# Patient Record
Sex: Female | Born: 1980 | Race: White | Hispanic: No | Marital: Married | Smoking: Former smoker
Health system: Southern US, Community
[De-identification: ages and names within clinical notes are randomized; demographics above are authoritative.]

## PROBLEM LIST (undated history)

## (undated) DIAGNOSIS — H469 Unspecified optic neuritis: Secondary | ICD-10-CM

## (undated) DIAGNOSIS — G35 Multiple sclerosis: Secondary | ICD-10-CM

## (undated) DIAGNOSIS — G43909 Migraine, unspecified, not intractable, without status migrainosus: Secondary | ICD-10-CM

## (undated) DIAGNOSIS — Z8616 Personal history of COVID-19: Secondary | ICD-10-CM

## (undated) DIAGNOSIS — M542 Cervicalgia: Secondary | ICD-10-CM

## (undated) HISTORY — DX: Migraine, unspecified, not intractable, without status migrainosus: G43.909

## (undated) HISTORY — PX: OVARY SURGERY: SHX727

## (undated) HISTORY — DX: Cervicalgia: M54.2

## (undated) HISTORY — DX: Personal history of COVID-19: Z86.16

---
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
## 2000-08-08 ENCOUNTER — Other Ambulatory Visit: Admission: RE | Admit: 2000-08-08 | Discharge: 2000-08-08 | Payer: Self-pay | Admitting: Gynecology

## 2001-05-11 ENCOUNTER — Ambulatory Visit (HOSPITAL_COMMUNITY): Admission: RE | Admit: 2001-05-11 | Discharge: 2001-05-11 | Payer: Self-pay | Admitting: Gynecology

## 2001-05-11 ENCOUNTER — Encounter (INDEPENDENT_AMBULATORY_CARE_PROVIDER_SITE_OTHER): Payer: Self-pay | Admitting: Specialist

## 2001-05-25 ENCOUNTER — Emergency Department (HOSPITAL_COMMUNITY): Admission: EM | Admit: 2001-05-25 | Discharge: 2001-05-25 | Payer: Self-pay | Admitting: *Deleted

## 2001-10-13 ENCOUNTER — Encounter: Payer: Self-pay | Admitting: *Deleted

## 2001-10-13 ENCOUNTER — Emergency Department (HOSPITAL_COMMUNITY): Admission: EM | Admit: 2001-10-13 | Discharge: 2001-10-13 | Payer: Self-pay | Admitting: Emergency Medicine

## 2002-12-24 ENCOUNTER — Other Ambulatory Visit: Admission: RE | Admit: 2002-12-24 | Discharge: 2002-12-24 | Payer: Self-pay | Admitting: Gynecology

## 2003-08-05 ENCOUNTER — Inpatient Hospital Stay (HOSPITAL_COMMUNITY): Admission: AD | Admit: 2003-08-05 | Discharge: 2003-08-07 | Payer: Self-pay | Admitting: Gynecology

## 2003-10-20 ENCOUNTER — Other Ambulatory Visit: Admission: RE | Admit: 2003-10-20 | Discharge: 2003-10-20 | Payer: Self-pay | Admitting: Gynecology

## 2004-12-26 ENCOUNTER — Other Ambulatory Visit: Admission: RE | Admit: 2004-12-26 | Discharge: 2004-12-26 | Payer: Self-pay | Admitting: Gynecology

## 2005-12-12 ENCOUNTER — Other Ambulatory Visit: Admission: RE | Admit: 2005-12-12 | Discharge: 2005-12-12 | Payer: Self-pay | Admitting: Gynecology

## 2006-05-19 ENCOUNTER — Encounter: Admission: RE | Admit: 2006-05-19 | Discharge: 2006-05-19 | Payer: Self-pay | Admitting: Women's Health

## 2006-07-01 ENCOUNTER — Inpatient Hospital Stay (HOSPITAL_COMMUNITY): Admission: AD | Admit: 2006-07-01 | Discharge: 2006-07-03 | Payer: Self-pay | Admitting: Gynecology

## 2006-08-12 ENCOUNTER — Other Ambulatory Visit: Admission: RE | Admit: 2006-08-12 | Discharge: 2006-08-12 | Payer: Self-pay | Admitting: Gynecology

## 2007-10-30 ENCOUNTER — Other Ambulatory Visit: Admission: RE | Admit: 2007-10-30 | Discharge: 2007-10-30 | Payer: Self-pay | Admitting: Gynecology

## 2007-12-29 ENCOUNTER — Encounter: Payer: Self-pay | Admitting: Gynecology

## 2007-12-29 ENCOUNTER — Ambulatory Visit (HOSPITAL_BASED_OUTPATIENT_CLINIC_OR_DEPARTMENT_OTHER): Admission: RE | Admit: 2007-12-29 | Discharge: 2007-12-29 | Payer: Self-pay | Admitting: Gynecology

## 2011-02-05 NOTE — Op Note (Signed)
Lisa Wu, Lisa Wu             ACCOUNT NO.:  [id number]   MEDICAL RECORD NO.:  [id number]          PATIENT TYPE:  AMB   LOCATION:  [facility]                         FACILITY:  [facility]   PHYSICIAN:  Timothy P. Fontaine, M.D.DATE OF BIRTH:  [birth year]   DATE OF PROCEDURE:  12/29/2007  DATE OF DISCHARGE:                               OPERATIVE REPORT   PREOPERATIVE DIAGNOSIS:  Irregular bleeding.   POSTOPERATIVE DIAGNOSIS:  Irregular bleeding.   PROCEDURE:  Hysteroscopy, D&C, removal of IUD.   SURGEON:  Timothy P. Fontaine, M.D.   ANESTHETIC:  General, with 1% lidocaine paracervical block.   COMPLICATIONS:  None.   ESTIMATED BLOOD LOSS:  Minimal.   SORBITOL DISCREPANCY:  Minimal.   SPECIMENS:  1. IUD, Mirena.  2. Endometrial curetting.   FINDINGS:  EUA, external BUS, vagina normal.  Cervix normal.  Uterus  normal size, shape and contour, retroverted.  Adnexa without masses.  Hysteroscopic was adequate, with right and left tubal ostia, fundus,  anterior-posterior uterine surfaces, lower uterine segment, endocervical  canal all visualized.  There were several areas of tufting of the  endometrium, with the remainder of the endometrium an atrophic pattern,  consistent with her [place].  Post D&C, all tufted areas were removed and  sent to pathology.   PROCEDURE:  The patient was taken to the operating room, underwent  general anesthesia, and was placed in the low dorsal lithotomy position,  received a perineovaginal preparation with Betadine solution.  Bladder  emptied with in-and-out Foley catheterization done in sterile technique.  EUA performed, and the patient was draped in the usual fashion.  The  cervix was visualized with a speculum.  Anterior lip grasped with a  single-tooth tenaculum.  Paracervical block using 1% lidocaine was  placed, a total of 10 mL.  Using forceps, the IUD string was grasped  within the endocervical canal, and the IUD was removed and sent to  pathology for gross only.  The cervix was then gently gradually dilated  to admit the operative hysteroscope.  Hysteroscopy was performed, with  findings noted above.  A sharp curettage was performed.  Specimen sent  to pathology.  Re-hysteroscopy showed an empty cavity,  good distention, no evidence of perforation.  The instruments were  removed.  Hemostasis visualized.  The speculum removed.  The patient was  placed in the supine position, awakened without difficulty, and taken to  the recovery room in good condition, having tolerated the procedure  well.      Timothy P. Fontaine, M.D.  Electronically Signed     TPF/MEDQ  D:  12/29/2007  T:  12/29/2007  Job:  [id number]

## 2011-02-05 NOTE — H&P (Signed)
Lisa Wu, Lisa Wu             ACCOUNT NO.:  [id number]   MEDICAL RECORD NO.:  [id number]          PATIENT TYPE:  AMB   LOCATION:  [facility]                         FACILITY:  [facility]   PHYSICIAN:  Timothy P. Fontaine, M.D.DATE OF BIRTH:  [birth year]   DATE OF ADMISSION:  12/29/2007  DATE OF DISCHARGE:                              HISTORY & PHYSICAL   The patient is being admitted to [facility] on December 29, 2007, at 7:30 for surgery.   CHIEF COMPLAINT:  Continued irregular bleeding.   HISTORY OF PRESENT ILLNESS:  A 30 year old, G2, P2 female with Jearld Adjutant  IUD who has continued have on and off bleeding despite trials of  nonsteroidals as well as low estrogen dose replacement.  She had an HSG  which showed a questionable endometrial polyp.  She is admitted for  removal of her IUD, hysteroscopy D&C.   PAST MEDICAL HISTORY:  Significant for MS.   PAST SURGICAL HISTORY:  Hysteroscopy D&C for polyp in 2002.   CURRENT MEDICATIONS:  1. Multivitamins.  2. Prozac 20 mg daily.   ALLERGIES:  MACROBID.   REVIEW OF SYSTEMS:  Noncontributory.   FAMILY HISTORY:  Noncontributory.   SOCIAL HISTORY:  Noncontributory.   ADMISSION PHYSICAL EXAMINATION:  HEENT:  Normal.  LUNGS:  Clear.  CARDIAC:  Regular rate.  No rubs, murmurs or gallops.  ABDOMINAL:  Exam benign.  PELVIC:  External BUS, vagina normal.  Cervix grossly normal. Uterus  normal size, midline, mobile, nontender.  Adnexa without masses or  tenderness.   ASSESSMENT AND PLAN:  A 30 year old, G2, P2 female with persistent  spotting with IUD in place after intercourse or any exercising or  jogging.  She has tried estrogen supplementation, nonsteroidals without  effect.  Sonohistogram with IUD in place implies endometrial cavity  defect suggestive of a polyp.  I reviewed situation with the patient.  It is very difficult to totally evaluate with IUD in place. Options of  continued monitoring with IUD versus removal of  the IUD, hysteroscopy  D&C was discussed.  The patient wants to go ahead and have it removed  with hysteroscopy D&C.  She has made arrangements to have it replaced 2  weeks postop with abstinence and between pending surgery results.  I  reviewed what is involved with the procedure, expected intraoperative  and postoperative courses, use of the hysteroscope, resectoscope, D&C.  She notes we are removing her IUD, she will have no birth control, and  has made arrangements to have it replaced. She understands there are no  guarantees as far as bleeding relief, that her irregular bleeding may  continue, worsen or change following this procedure.  I reviewed the  risks of the procedure to include bleeding, transfusion, infection,  uterine perforation, damage to internal organs including bowel, bladder,  ureters, vessels and nerves necessitating major exploratory reparative  surgeries, future reparative surgeries, ostomy formation all discussed,  understood and accepted.  The patient's questions were answered to her  satisfaction.  She is ready to proceed with surgery.      Timothy P. Fontaine, M.D.  Electronically Signed  TPF/MEDQ  D:  12/28/2007  T:  12/28/2007  Job:  [id number]

## 2011-02-08 NOTE — Op Note (Signed)
[facility]  Patient:    Lisa Wu, Lisa Wu Visit Number: [id number] MRN: [id number]          Service Type: DSU Location: [facility] Attending Physician:  Merrily Pew Proc. Date: 05/11/01 Adm. Date:  [id number]                             Operative Report  PREOPERATIVE DIAGNOSIS:       Endometrial polyp.  POSTOPERATIVE DIAGNOSIS:      Endometrial polyp.  PROCEDURE:                    Hysteroscopic resection of endometrial polyp.  SURGEON:                      Timothy P. Fontaine, M.D.  ANESTHESIA:                   MAC, paracervical block 1% lidocaine.  ESTIMATED BLOOD LOSS:         Minimal.  COMPLICATIONS:                None.  SPECIMEN:                     1. Endometrial polyp.                               2. Endometrial curettings.  SORBITOL DISCREPANCY:         Approximately 100-140 cc.  PROCEDURE:                    The patient was taken to the operating room and underwent IV sedation.  He was placed in the low dorsal lithotomy position and received a perineal vaginal preparation with Betadine solution.  The bladder was emptied with Foley catheterization and E-wave performed.  The patient was then draped in the usual fashion and the cervix was visualized with the surgical speculum and the anterior lip grasped with a single-tooth tenaculum. A paracervical block using 1% lidocaine was placed, a total of approximately 10 cc used.  Cervix was then gradually and gently dilated to admit the hysteroscope, and hysteroscopy was performed; noting a small endometrial polyp on the posterior and mid uterine surface.  Fingerlike appearance, with no abnormality seen.  The hysteroscopy was otherwise normal, noting right and left tubal velocity of fundus, anterior and posterior uterine surfaces, lower uterine segment and endocervical all being visualized.  The poly was then excised in one pass using the right-angled loop, and subsequently a  sharp curettage was performed.  Rehysteroscopy showed good distention, no abnormalities, adequate hemostasis and no evidence of perforation.  The instruments were then removed.  There was some oozing at the anterior cervical mucosa, and two interrupted 3-0 Vicryl sutures were placed for hemostasis. Adequate hemostasis was then achieved.  The speculum was removed.  The patient was placed in the supine position and then taken to the recovery room in good condition, having tolerated the procedure well. DD:  05/11/01 TD:  05/11/01 Job: [id number]

## 2011-02-08 NOTE — H&P (Signed)
NAME:  Lisa Wu, HIPPS                       ACCOUNT NO.:  [id number]   MEDICAL RECORD NO.:  [id number]                   PATIENT TYPE:  MAT   LOCATION:  [facility]                                 FACILITY:  [facility]   PHYSICIAN:  Juan H. Lily Peer, M.D.             DATE OF BIRTH:  [birth year]   DATE OF ADMISSION:  08/05/2003  DATE OF DISCHARGE:                                HISTORY & PHYSICAL   CHIEF COMPLAINT:  Contractions.   HISTORY OF PRESENT ILLNESS:  The patient is a 30 year old gravida 1 para 0  at 37 and six-sevenths weeks gestation with an estimated date of confinement  of August 06, 2003; presented to [facility] complaining of increased  contractions in frequency and intensity at approximately 0500 this morning.  She presented to [facility] and was found to be contracting every four  to five minutes apart, very intense contractions, reassuring fetal heart  rate tracing.  Her cervix was 3 cm, 80%, -1 station, with stable vital  signs.  Prenatal course significant for the fact that the patient has  history of multiple sclerosis.  She is Rh negative, received RhoGAM at [redacted]  weeks gestation, had been treated also for urinary tract infection.  See  [place] form for additional information; otherwise, benign prenatal  course.   PAST MEDICAL HISTORY:  The patient is allergic to [facility].  She has history  of multiple sclerosis.  Had wisdom tooth removed in the past.   REVIEW OF SYSTEMS:  See [place] form.   PHYSICAL EXAMINATION:  VITAL SIGNS:  Stable; afebrile  GENERAL:  Well-developed, well-nourished female complaining of contractions.  HEENT:  Unremarkable.  NECK:  Supple, trachea midline.  No carotid bruits, no thyromegaly.  LUNGS:  Clear to auscultation without rhonchi or wheezes.  HEART:  Regular rate and rhythm, no murmurs or gallop.  BREAST:  Exam not done.  ABDOMEN:  Gravid uterus, vertex presentation by [facility] maneuver, positive  fetal heart tones.  PELVIC:  Cervix 3 cm, 80% effaced, -2 station, intact membranes, bulging.  EXTREMITIES:  DTRs 1+, negative clonus, trace edema.   PRENATAL LABORATORY DATA:  A negative blood type, negative antibody screen.  VDRL was nonreactive.  Rubella immune.  Hepatitis B surface antigen and HIV  were negative.  Alpha-fetoprotein was normal.  PPD test was negative on June  24.  Diabetes screen was normal.  GBS culture was positive.   ASSESSMENT:  A 30 year old gravida 1 para 0 at 59 and six-sevenths weeks  gestation in active labor with advanced cervical dilatation with bulging  membranes, 3 cm, 80%, -2 station.  Will be admitted to labor and delivery.  Once she has an epidural on-board will proceed with artificial rupture of  membranes and internal monitoring to include fetal scalp electrode and  intrauterine pressure catheter.  Due to the fact that she had positive GBS  culture-  proven will go ahead and start her on penicillin  G 5,000,000 units IV  initially followed by 2,500,000 units q.4h.  Anticipate vaginal delivery.   PLAN:  As per assessment above.                                               Juan H. Lily Peer, M.D.    JHF/MEDQ  D:  08/05/2003  T:  08/05/2003  Job:  [id number]

## 2011-02-08 NOTE — Discharge Summary (Signed)
NAMEBUENA, BOEHM                       ACCOUNT NO.:  [id number]   MEDICAL RECORD NO.:  [id number]                   PATIENT TYPE:  INP   LOCATION:  9140                                 FACILITY:  [facility]   PHYSICIAN:  Ivor Costa. Farrel Gobble, M.D.              DATE OF BIRTH:  1981-02-19   DATE OF ADMISSION:  08/05/2003  DATE OF DISCHARGE:  08/07/2003                                 DISCHARGE SUMMARY   DISCHARGE DIAGNOSES:  1. Intrauterine pregnancy at 39+ weeks delivered.  2. Positive group B strep.  3. The patient with multiple sclerosis.  4. Rh negative.  5. Status post spontaneous vaginal delivery.   HISTORY:  This is a 30 years of age female gravida 1, para 67 with an EDC of  August 06, 2003.  Prenatal course was complicated by Rh negative.  The  patient received RhoGAM at pregnancy.  The patient was also had multiple  sclerosis.  The patient is positive group B strep during pregnancy.   HOSPITAL COURSE:  On August 05, 2003 the patient presented at 39+ weeks in  labor, was begun on IV penicillin G for group B strep prophylaxis.  Labor  was augmented with Pitocin and thus on August 05, 2003 the patient  underwent a spontaneous vaginal delivery of a female, Apgars of 9 and 10,  weight of 8 pounds and 1 ounce.  A second-degree which was repaired without  complications.  It was noted that there was recurrent episodes of uterine  atony with increased vaginal bleeding despite continuous uterine massage.  Therefore, the patient was treated with p.o. Cytotec with instant results.  Postpartum, the patient remained afebrile, voiding, in stable condition, and  she was discharged to home on August 07, 2003 in satisfactory condition  and given [facility] Gynecology postpartum instruction.  Postpartum labs,  the patient is O negative, rubella immune.  On August 06, 2003 hemoglobin  was 12.   DISPOSITION:  The patient is discharged to home, for return in six weeks  will have to call  the office.  Was given prescription for Tylox p.r.n. pain  and was given RhoGAM prior to discharge.     Susa Loffler, P.A.                    Ivor Costa. Farrel Gobble, M.D.    TSG/MEDQ  D:  08/29/2003  T:  08/29/2003  Job:  [id number]

## 2011-02-08 NOTE — H&P (Signed)
[facility]  Patient:    Lisa Wu, Lisa Wu                      MRN: [id number] Attending:  Nadyne Coombes. Fontaine, M.D.                         History and Physical  SCHEDULED ADMISSION DATE:     May 11, 2001.  CHIEF COMPLAINT:              Irregular menses, endometrial defect.  HISTORY OF PRESENT ILLNESS:   A 30 year old, G0, initially presented complaining of irregular menses. The patient had been on birth control pills until the fall of 2001 and stopped them in anticipation of pregnancy. The patient notes that she almost bled continuously on and off since then where she would bleed for a week and then stop for several days and then bleed again for a week. The patient underwent an ultrasound April 2002 with findings of fluid within the endometrial cavity outlining an echogenic area measuring 8 x 7 x 6 mm. The patient had a followup sonohysterogram following a subsequent menses and the cavity again showed persistence of this single echogenic focus on the mid posterior wall. The patient notes that since then her periods have continued to be somewhat irregular, although she is not having intermenstrual bleeding, and the options of expectant management versus hysteroscopic evaluation of the endometrium were reviewed and the patient wants to proceed with hysteroscopy. If indeed a defect is encountered, then will remove this. If not, then we will terminate the procedure. The patient also had outpatient labs including normal thyroid and normal prolactin. The patient has decided against proceeding with pregnancy trial at this time and is to begin on Ortho Tri-Cyclen with her next menses.  PAST MEDICAL HISTORY:         Significant for multiple sclerosis for which she is currently on no medication.  PAST SURGICAL HISTORY:        None.  ALLERGIES:                    None.  REVIEW OF SYSTEMS:            Noncontributory.  MEDICATIONS:                   Ortho Tri-Cyclen.  FAMILY HISTORY:               Noncontributory.  SOCIAL HISTORY:               No cigarette, alcohol use.  ADMISSION PHYSICAL EXAMINATION:  VITAL SIGNS:                  Afebrile, vital signs are stable.  HEENT:                        Normal.  LUNGS:                        Clear.  CARDIAC:                      Regular rate without rubs, murmurs, or gallops.  ABDOMEN:                      Benign.  PELVIC:  External BUS, vagina normal. Cervix normal. Uterus retroverted, normal size, nontender. Adnexa without masses or tenderness.  ASSESSMENT:                   This is a 30 year old, G0, initial history of irregular menses with intermenstrual bleeding now with irregular menses, no intermenstrual bleeding. Ultrasound evaluation shows a persistent echogenic focus within the cavity.  PLAN:                         Plan is for hysteroscopy, possible resection, possible D&C. The risks, benefits, indications, and alternatives for the procedure were discussed with her to include continued expectant management with re-ultrasound in several cycles versus hysteroscopy now. The patient would prefer hysteroscopy now. The risks, benefits, indications and alternatives for the procedure were reviewed with her to include the expected intraoperative and postoperative courses. The risks of uterine perforation leading to inadvertent injury to internal organs including bowel, bladder, ureters, vessels, and nerves necessitating major exploratory reparative surgeries and future reparative surgeries, including ostomy formation, was discussed, understood, and accepted. The risk of hemorrhage necessitating transfusion and the risks of transfusion, to include hepatitis, HIV, transfusion reaction, mad cow disease and other unknown entities was all reviewed, understood, and accepted. The risks of infection requiring prolonged antibiotics was reviewed with her. The  patients questions were answered to her satisfaction and she is ready to proceed with surgery. DD:  04/30/01 TD:  04/30/01 Job: [id number]

## 2011-06-18 LAB — POCT PREGNANCY, URINE
Operator id: 268271
Preg Test, Ur: NEGATIVE

## 2011-08-29 ENCOUNTER — Other Ambulatory Visit: Payer: Self-pay | Admitting: Obstetrics and Gynecology

## 2013-07-08 ENCOUNTER — Other Ambulatory Visit: Payer: Self-pay | Admitting: Obstetrics and Gynecology

## 2013-08-30 ENCOUNTER — Telehealth: Payer: Self-pay | Admitting: Neurology

## 2013-08-30 NOTE — Telephone Encounter (Signed)
Patient called stating that she is a patient of Dr. Sandria Manly and would like to make an appointment to discuss some numbness on her arms that has started back again last week. Please call.

## 2013-08-30 NOTE — Telephone Encounter (Signed)
Informed patient that  she has not been seen in our office since 2008(Lisa Wu), she will need to contact her pcp for a new referral  first, then she can be scheduled , patient verbalized understanding.

## 2013-08-31 DIAGNOSIS — G35 Multiple sclerosis: Secondary | ICD-10-CM | POA: Insufficient documentation

## 2013-10-12 DIAGNOSIS — G43909 Migraine, unspecified, not intractable, without status migrainosus: Secondary | ICD-10-CM | POA: Insufficient documentation

## 2013-10-12 DIAGNOSIS — N319 Neuromuscular dysfunction of bladder, unspecified: Secondary | ICD-10-CM | POA: Insufficient documentation

## 2013-10-12 DIAGNOSIS — N390 Urinary tract infection, site not specified: Secondary | ICD-10-CM | POA: Insufficient documentation

## 2015-12-07 ENCOUNTER — Emergency Department (HOSPITAL_COMMUNITY): Payer: Managed Care, Other (non HMO)

## 2015-12-07 ENCOUNTER — Observation Stay (HOSPITAL_COMMUNITY)
Admission: EM | Admit: 2015-12-07 | Discharge: 2015-12-08 | Disposition: A | Discharge status: Home / Self Care | Payer: Managed Care, Other (non HMO) | Attending: Internal Medicine | Admitting: Internal Medicine

## 2015-12-07 ENCOUNTER — Encounter (HOSPITAL_COMMUNITY): Payer: Self-pay | Admitting: Emergency Medicine

## 2015-12-07 DIAGNOSIS — G35 Multiple sclerosis: Principal | ICD-10-CM | POA: Diagnosis present

## 2015-12-07 DIAGNOSIS — H532 Diplopia: Secondary | ICD-10-CM | POA: Diagnosis not present

## 2015-12-07 DIAGNOSIS — D72829 Elevated white blood cell count, unspecified: Secondary | ICD-10-CM | POA: Diagnosis not present

## 2015-12-07 DIAGNOSIS — R42 Dizziness and giddiness: Secondary | ICD-10-CM | POA: Insufficient documentation

## 2015-12-07 HISTORY — DX: Multiple sclerosis: G35

## 2015-12-07 HISTORY — DX: Unspecified optic neuritis: H46.9

## 2015-12-07 LAB — CBC WITH DIFFERENTIAL/PLATELET
BASOS ABS: 0.1 10*3/uL (ref 0.0–0.1)
Basophils Relative: 0 %
EOS PCT: 2 %
Eosinophils Absolute: 0.3 10*3/uL (ref 0.0–0.7)
HEMATOCRIT: 40.9 % (ref 36.0–46.0)
Hemoglobin: 13.7 g/dL (ref 12.0–15.0)
LYMPHS ABS: 4.4 10*3/uL — AB (ref 0.7–4.0)
LYMPHS PCT: 38 %
MCH: 30.2 pg (ref 26.0–34.0)
MCHC: 33.5 g/dL (ref 30.0–36.0)
MCV: 90.3 fL (ref 78.0–100.0)
MONO ABS: 0.6 10*3/uL (ref 0.1–1.0)
Monocytes Relative: 5 %
NEUTROS ABS: 6.5 10*3/uL (ref 1.7–7.7)
Neutrophils Relative %: 55 %
Platelets: 280 10*3/uL (ref 150–400)
RBC: 4.53 MIL/uL (ref 3.87–5.11)
RDW: 13 % (ref 11.5–15.5)
WBC: 11.8 10*3/uL — AB (ref 4.0–10.5)

## 2015-12-07 LAB — BASIC METABOLIC PANEL
ANION GAP: 13 (ref 5–15)
BUN: 13 mg/dL (ref 6–20)
CHLORIDE: 101 mmol/L (ref 101–111)
CO2: 26 mmol/L (ref 22–32)
Calcium: 9.5 mg/dL (ref 8.9–10.3)
Creatinine, Ser: 0.64 mg/dL (ref 0.44–1.00)
GFR calc Af Amer: >60 mL/min (ref >60)
GFR calc non Af Amer: >60 mL/min (ref >60)
GLUCOSE: 111 mg/dL — AB (ref 65–99)
POTASSIUM: 3.7 mmol/L (ref 3.5–5.1)
Sodium: 140 mmol/L (ref 135–145)

## 2015-12-07 LAB — POC URINE PREG, ED: Preg Test, Ur: NEGATIVE

## 2015-12-07 MED ORDER — GADOBENATE DIMEGLUMINE 529 MG/ML IV SOLN
12.0000 mL | Freq: Once | INTRAVENOUS | Status: AC | PRN
  Administered 2015-12-07: 12 mL via INTRAVENOUS

## 2015-12-07 NOTE — ED Notes (Signed)
Pt at bedside updating patient

## 2015-12-07 NOTE — ED Notes (Signed)
Pt. reports blurred vision/double vision with dizziness and nausea onset 2 days ago , advised by her neurologist to see an opthalmologist , denies eye injury or pain , pt. stated history of multiple sclerosis .

## 2015-12-07 NOTE — ED Notes (Signed)
Neurology at bedside.

## 2015-12-07 NOTE — ED Provider Notes (Signed)
CSN: [id number]     Arrival date & time 12/07/15  2003 History  By signing my name below, I, Placido Sou, attest that this documentation has been prepared under the direction and in the presence of Roxy Horseman, PA-C. Electronically Signed: Placido Sou, ED Scribe. 12/07/2015. 9:11 PM.   Chief Complaint  Patient presents with  . Eye Problem   The history is provided by the patient. No language interpreter was used.   HPI Comments: Lisa Wu is a 35 y.o. female with a PMHx of MS and optic neuritis who presents to the Emergency Department complaining of worsening, intermittent, bilateral, double vision onset 2 days ago. She notes a hx of similar symptoms to her right eye but denies it has ever occurred bilaterally. Her neurologist recommended she go to UC who then recommended she be seen at the ED for further evaluation. She reports associated nausea and dizziness. Pt reports unassociated left sided abd pain onset 3 weeks ago that intermittently changes from a burning to a cold sensation further noting a hx of constipation and migraines. She was recently prescribed abx for a UTI. Pt denies numbness, weakness, vision loss, migraines or any other associated symptoms at this time.    Past Medical History  Diagnosis Date  . Multiple sclerosis (HCC)   . Optic neuritis    Past Surgical History  Procedure Laterality Date  . Ovary surgery     No family history on file. Social History  Substance Use Topics  . Smoking status: Current Every Day Smoker  . Smokeless tobacco: None  . Alcohol Use: No   OB History    No data available     Review of Systems  Eyes: Positive for visual disturbance (double vision).  Gastrointestinal: Positive for nausea and abdominal pain. Negative for constipation.  Neurological: Positive for dizziness. Negative for weakness, numbness and headaches.    Allergies  Review of patient's allergies indicates no known allergies.  Home Medications    Prior to Admission medications   Not on File   BP 111/80 mmHg  Pulse 80  Temp(Src) 98.2 F (36.8 C) (Oral)  Resp 16  Ht 5\' 4"  (1.626 m)  Wt 133 lb (60.328 kg)  BMI 22.82 kg/m2  SpO2 100% Physical Exam  Constitutional: She is oriented to person, place, and time. She appears well-developed and well-nourished.  HENT:  Head: Normocephalic and atraumatic.  Eyes: Conjunctivae and EOM are normal. Pupils are equal, round, and reactive to light.  Diploplia Inaccurate finger counting unless 1 eye covered Normal visual fields No sign of entrapment No pain with eye movement  Neck: Normal range of motion. Neck supple.  Cardiovascular: Normal rate and regular rhythm.  Exam reveals no gallop and no friction rub.   No murmur heard. Pulmonary/Chest: Effort normal and breath sounds normal. No respiratory distress. She has no wheezes. She has no rales. She exhibits no tenderness.  Abdominal: Soft. Bowel sounds are normal. She exhibits no distension and no mass. There is no tenderness. There is no rebound and no guarding.  Musculoskeletal: Normal range of motion. She exhibits no edema or tenderness.  Neurological: She is alert and oriented to person, place, and time.  CN 3-12 intact, in accurate finger to nose with both eyes open, accurate with one eye closed, no pronator drift, normal heel-to-shin, speech is clear Sensation and strength intact throughout  Skin: Skin is warm and dry.  Psychiatric: She has a normal mood and affect. Her behavior is normal. Judgment and thought  content normal.  Nursing note and vitals reviewed.   ED Course  Procedures  DIAGNOSTIC STUDIES: Oxygen Saturation is 100% on RA, normal by my interpretation.    COORDINATION OF CARE: 8:42 PM Discussed next steps with pt. She verbalized understanding and is agreeable with the plan.   Labs Review Labs Reviewed  CBC WITH DIFFERENTIAL/PLATELET - Abnormal; Notable for the following:    WBC 11.8 (*)    Lymphs Abs 4.4  (*)    All other components within normal limits  BASIC METABOLIC PANEL - Abnormal; Notable for the following:    Glucose, Bld 111 (*)    All other components within normal limits  POC URINE PREG, ED    Imaging Review Mr Laqueta Jean Wo Contrast  12/07/2015  CLINICAL DATA:  Initial evaluation for acute diplopia. History of multiple sclerosis. EXAM: MRI HEAD WITHOUT AND WITH CONTRAST TECHNIQUE: Multiplanar, multiecho pulse sequences of the brain and surrounding structures were obtained without and with intravenous contrast. CONTRAST:  12 cc of MultiHance. COMPARISON:  None. FINDINGS: Diffusion-weighted imaging demonstrates no evidence for acute infarct. Gray-white matter differentiation maintained. Major intracranial vascular flow voids are preserved. No acute or chronic intracranial hemorrhage. No areas of chronic infarction. Multiple scattered T2/FLAIR hyperintense foci present within the periventricular, deep, and subcortical white matter both cerebral hemispheres in a distribution most compatible with underlying demyelinating disease. Scattered infratentorial lesions present as well, most prevalent at the inferior aspect of the pons. Many of the periventricular foci oriented perpendicular to the lateral ventricles. Findings most compatible with provided history of multiple sclerosis. There is a prominent 9 mm enhancing lesion within the deep white matter of the left occipital lobe, consistent with active demyelination. Associated diffusion signal abnormality present within this lesion. No other definite evidence for active demyelination. No mass lesion, midline shift, or mass effect. No hydrocephalus. No extra-axial fluid collection. Major dural sinuses are grossly patent. Craniocervical junction normal. Pituitary gland within normal limits. No acute abnormality about the orbits. No definite evidence for active nor optic neuritis on this exam. Paranasal sinuses are clear. No mastoid effusion. Inner ear  structures normal. Bone marrow signal intensity within normal limits. No scalp soft tissue abnormality. IMPRESSION: Multiple T2/FLAIR hyperintense foci involving the supratentorial and infratentorial brain as above, compatible with provided history of multiple sclerosis. 9 mm enhancing lesion within the left occipital lobe consistent with active demyelination. Electronically Signed   By: Rise Mu M.D.   On: 12/07/2015 23:39   I have personally reviewed and evaluated these images and lab results as part of my medical decision-making.    MDM   Final diagnoses:  Double vision  MS (multiple sclerosis) (HCC)    Patient with diplopia. History of MS. Symptoms resolve with covering one eye. Discussed with Dr. Lynelle Doctor, who recommends neurology consultation.  Appreciate consultation from Dr. Cena Benton, who recommends admission by medicine.  He will continue to follow along.  I personally performed the services described in this documentation, which was scribed in my presence. The recorded information has been reviewed and is accurate.      Roxy Horseman, PA-C 12/08/15 0041  Linwood Dibbles, MD 12/08/15 1228

## 2015-12-08 ENCOUNTER — Encounter (HOSPITAL_COMMUNITY): Payer: Self-pay | Admitting: *Deleted

## 2015-12-08 DIAGNOSIS — G35 Multiple sclerosis: Secondary | ICD-10-CM | POA: Insufficient documentation

## 2015-12-08 DIAGNOSIS — D72829 Elevated white blood cell count, unspecified: Secondary | ICD-10-CM | POA: Diagnosis not present

## 2015-12-08 DIAGNOSIS — H532 Diplopia: Secondary | ICD-10-CM | POA: Diagnosis present

## 2015-12-08 LAB — URINALYSIS, ROUTINE W REFLEX MICROSCOPIC
BILIRUBIN URINE: NEGATIVE
Glucose, UA: NEGATIVE mg/dL
HGB URINE DIPSTICK: NEGATIVE
KETONES UR: NEGATIVE mg/dL
Leukocytes, UA: NEGATIVE
Nitrite: NEGATIVE
PH: 6 (ref 5.0–8.0)
Protein, ur: NEGATIVE mg/dL
SPECIFIC GRAVITY, URINE: 1.017 (ref 1.005–1.030)

## 2015-12-08 LAB — CBC
HEMATOCRIT: 40.1 % (ref 36.0–46.0)
HEMOGLOBIN: 13.3 g/dL (ref 12.0–15.0)
MCH: 29.6 pg (ref 26.0–34.0)
MCHC: 33.2 g/dL (ref 30.0–36.0)
MCV: 89.1 fL (ref 78.0–100.0)
Platelets: 262 10*3/uL (ref 150–400)
RBC: 4.5 MIL/uL (ref 3.87–5.11)
RDW: 13 % (ref 11.5–15.5)
WBC: 10 10*3/uL (ref 4.0–10.5)

## 2015-12-08 MED ORDER — METOCLOPRAMIDE HCL 5 MG/ML IJ SOLN
10.0000 mg | Freq: Four times a day (QID) | INTRAMUSCULAR | Status: AC
  Administered 2015-12-08 (×2): 10 mg via INTRAVENOUS
  Filled 2015-12-08 (×2): qty 2

## 2015-12-08 MED ORDER — SODIUM CHLORIDE 0.9 % IV SOLN
250.0000 mg | Freq: Four times a day (QID) | INTRAVENOUS | Status: DC
  Administered 2015-12-08 (×2): 250 mg via INTRAVENOUS
  Filled 2015-12-08 (×4): qty 2

## 2015-12-08 MED ORDER — ENOXAPARIN SODIUM 40 MG/0.4ML ~~LOC~~ SOLN
40.0000 mg | Freq: Every day | SUBCUTANEOUS | Status: DC
Start: 2015-12-08 — End: 2015-12-08
  Administered 2015-12-08: 40 mg via SUBCUTANEOUS
  Filled 2015-12-08: qty 0.4

## 2015-12-08 MED ORDER — PANTOPRAZOLE SODIUM 40 MG PO TBEC: 40.0000 mg | DELAYED_RELEASE_TABLET | Freq: Two times a day (BID) | ORAL | Status: DC

## 2015-12-08 MED ORDER — ASPIRIN-ACETAMINOPHEN-CAFFEINE 250-250-65 MG PO TABS: 1.0000 | ORAL_TABLET | Freq: Four times a day (QID) | ORAL | Status: DC | PRN

## 2015-12-08 MED ORDER — ONDANSETRON HCL 4 MG PO TABS: 4.0000 mg | ORAL_TABLET | Freq: Four times a day (QID) | ORAL | Status: DC | PRN

## 2015-12-08 MED ORDER — SODIUM CHLORIDE 0.9 % IV SOLN
1000.0000 mg | Freq: Once | INTRAVENOUS | Status: AC
  Administered 2015-12-08: 1000 mg via INTRAVENOUS
  Filled 2015-12-08: qty 8

## 2015-12-08 MED ORDER — DIPHENHYDRAMINE HCL 50 MG/ML IJ SOLN
25.0000 mg | Freq: Once | INTRAMUSCULAR | Status: AC
  Administered 2015-12-08: 25 mg via INTRAVENOUS
  Filled 2015-12-08: qty 1

## 2015-12-08 MED ORDER — METOCLOPRAMIDE HCL 5 MG PO TABS: 5.0000 mg | ORAL_TABLET | Freq: Four times a day (QID) | ORAL | Status: DC | PRN

## 2015-12-08 MED ORDER — DIAZEPAM 2 MG PO TABS: 1.0000 mg | ORAL_TABLET | Freq: Four times a day (QID) | ORAL | Status: DC | PRN

## 2015-12-08 MED ORDER — ONDANSETRON HCL 4 MG/2ML IJ SOLN: 4.0000 mg | Freq: Four times a day (QID) | INTRAMUSCULAR | Status: DC | PRN

## 2015-12-08 MED ORDER — SODIUM CHLORIDE 0.9% FLUSH
3.0000 mL | Freq: Two times a day (BID) | INTRAVENOUS | Status: DC
  Administered 2015-12-08 (×2): 3 mL via INTRAVENOUS

## 2015-12-08 MED ORDER — DIAZEPAM 5 MG/ML IJ SOLN
5.0000 mg | Freq: Once | INTRAMUSCULAR | Status: AC
  Administered 2015-12-08: 5 mg via INTRAVENOUS
  Filled 2015-12-08: qty 2

## 2015-12-08 MED ORDER — SODIUM CHLORIDE 0.9 % IV SOLN: 500.0000 mg | Freq: Two times a day (BID) | INTRAVENOUS | Status: DC

## 2015-12-08 MED ORDER — PANTOPRAZOLE SODIUM 40 MG PO TBEC
40.0000 mg | DELAYED_RELEASE_TABLET | Freq: Two times a day (BID) | ORAL | Status: DC
  Administered 2015-12-08 (×2): 40 mg via ORAL
  Filled 2015-12-08 (×2): qty 1

## 2015-12-08 MED ORDER — DIAZEPAM 5 MG PO TABS: 5.0000 mg | ORAL_TABLET | Freq: Four times a day (QID) | ORAL | Status: DC | PRN

## 2015-12-08 NOTE — Discharge Instructions (Signed)
Multiple Sclerosis °Multiple sclerosis (MS) is a disease of the central nervous system. It leads to the loss of the insulating covering of the nerves (myelin sheath) of your brain. When this happens, brain signals do not get sent properly or may not get sent at all. The age of onset of MS varies.  °CAUSES °The cause of MS is unknown. However, it is more common in the [place] than in the southern [place]. °RISK FACTORS °There is a higher number of women with MS than men. MS is not an illness that is passed down to you from your family members (inherited). However, your risk of MS is higher if you have a relative with MS. °SIGNS AND SYMPTOMS  °The symptoms of MS occur in episodes or attacks. These attacks may last weeks to months. There may be long periods of almost no symptoms between attacks. The symptoms of MS vary. This is because of the many different ways it affects the central nervous system. The main symptoms of MS include: °· Vision problems and eye pain. °· Numbness. °· Weakness. °· Inability to move your arms, hands, feet, or legs (paralysis). °· Balance problems. °· Tremors. °DIAGNOSIS  °Your health care provider can diagnose MS with the help of imaging exams and lab tests. These may include specialized X-ray exams and spinal fluid tests. The best imaging exam to confirm a diagnosis of MS is an MRI. °TREATMENT  °There is no known cure for MS, but there are medicines that can decrease the number and frequency of attacks. Steroids are often used for short-term relief. Physical and occupational therapy may also help. There are also many new alternative or complementary treatments available to help control the symptoms of MS. Ask your health care provider if any of these other options are right for you. °HOME CARE INSTRUCTIONS  °· Take medicines as directed by your health care provider. °· Exercise as directed by your health care provider. °SEEK MEDICAL CARE IF: °You begin to feel  depressed. °SEEK IMMEDIATE MEDICAL CARE IF: °· You develop paralysis. °· You have problems with bladder, bowel, or sexual function. °· You develop mental changes, such as forgetfulness or mood swings. °· You have a period of uncontrolled movements (seizure). °  °This information is not intended to replace advice given to you by your health care provider. Make sure you discuss any questions you have with your health care provider. °  °Document Released: 09/06/2000 Document Revised: 09/14/2013 Document Reviewed: 05/17/2013 °Elsevier Interactive Patient Education ©2016 [employer] ° °

## 2015-12-08 NOTE — Progress Notes (Signed)
[facility]  Patient Status:   New pt for [facility] this admission  [facility] is providing the following services:  HHRN and Home infusion pharmacy for home Solumedrol.  [facility] will see pt at home between 8-10 Am on Saturday 12-09-15.  If patient discharges after hours, please call [phone].   Sedalia Muta 12/08/2015, 4:14 PM

## 2015-12-08 NOTE — Discharge Planning (Signed)
AVS reviewed and given to patient who verbalizes understanding. SL flushed and pt is discharging home with SL. Dc'd ambulatory to private car home with all personal belongings accompanied by family at 39.

## 2015-12-08 NOTE — ED Notes (Signed)
Admitting at bedside 

## 2015-12-08 NOTE — Consult Note (Signed)
Neurology Consultation Reason for Consult: diplopia Referring Physician: ED PA  CC: double vision  History is obtained from: patient  HPI: Aberdeen E Boardley is a 35 y.o. female dx with MS since age of 42 not on DMT because of side effects with most.  She started seeing double yesterday and called her neurologist at [facility] in [place] who asked her to go to an ophthalmologist but as she could not get an appointment till next week she came to the ED.  MRI shows new left occipital enhancing lesion.  Has been fighting a vaginal bacterial infection and has been on flagyl.  She is now on 1000q week and her next dose is nect Monday.  I discussed the risks and benefits of iv solumedrol and she would like to come in for therapuy.  Please note that [facility] and she could possibly be discharged tomorrow to finish 2-4 other doses at home.  Denies gait instability.  She occasionally has nocturnal fevers.  No weakness numbness dysaphagia.    ROS: A 14 point ROS was performed and is negative except as noted in the HPI.   Past Medical History  Diagnosis Date  . Multiple sclerosis (HCC)   . Optic neuritis     No family history on file.   Social History:  reports that she has been smoking.  She does not have any smokeless tobacco history on file. She reports that she does not drink alcohol or use illicit drugs.  Exam: Current vital signs: BP 111/80 mmHg  Pulse 80  Temp(Src) 98.2 F (36.8 C) (Oral)  Resp 16  Ht  (1.626 m)  Wt 60.328 kg (133 lb)  BMI 22.82 kg/m2  SpO2 100% Vital signs in last 24 hours: Temp:  [98.2 F (36.8 C)] 98.2 F (36.8 C) (03/16 2012) Pulse Rate:  [80] 80 (03/16 2012) Resp:  [16] 16 (03/16 2012) BP: (111)/(80) 111/80 mmHg (03/16 2012) SpO2:  [100 %] 100 % (03/16 2012) Weight:  [60.328 kg (133 lb)] 60.328 kg (133 lb) (03/16 2012)   Physical Exam  Constitutional: Appears well-developed and well-nourished.  Psych: Affect appropriate to  situation Eyes: No scleral injection HENT: No OP obstrucion Head: Normocephalic.  Cardiovascular: Normal rate and regular rhythm.  Respiratory: Effort normal and breath sounds normal to anterior ascultation GI: Soft.  No distension. There is no tenderness.  Skin: WDI  Neuro: Mental Status: Patient is awake, alert, oriented to person, place, month, year, and situation Patient is able to give a clear and coherent history. No signs of aphasia or neglect Cranial Nerves: II: Visual Fields are full. Pupils are equal, round, and reactive to light. III,IV, VI: EOMI but diploplia can be elicited on end gaze to either side.  V: Facial sensation is symmetric to temperature VII: Facial movement is symmetric.  VIII: hearing is intact to voice X: Uvula elevates symmetrically XI: Shoulder shrug is symmetric. XII: tongue is midline without atrophy or fasciculations.  Motor: Tone is normal. Bulk is normal. 5/5 strength was present in all four extremities except for the left hip flexion that is 4/5 Sensory: Sensation is symmetric to light touch and temperature in the arms and legs. Deep Tendon Reflexes: 2+ and symmetric in the biceps and patellae.  Plantars: Toes are downgoing bilaterally.  Cerebellar: FNF and HKS are intact bilaterally    I have reviewed labs in epic and the results pertinent to this consultation are:  I have reviewed the images obtained: MRI brain shows left  occipital enhancing lesion  Impression: MS relapse4, diplopia, new enhancing left occipital lesion.  Will [facility] with [facility] so that she can finish her IV solumedriol at home if this can be completed there.  OT eval.  Follow up with her neurologist at [facility].  Needs DMT.  Eye patch to the left eye.  Reglan q6 hours for nausea.  Valium 5mg  now for vertigo from double vision.   Benadryl 5mg .   Solumedrol 1gm STAT and then 2-4 more days to be dictated by day team.  Recommendations: 1) as  above

## 2015-12-08 NOTE — H&P (Signed)
[facility] History and Physical  SAPHIA VANDERFORD WUJ:[id number] DOB: 1981/06/30 DOA: 12/07/2015  Referring physician: ED PCP: No primary care provider on file.   Chief Complaint: Double vision  HPI:  Ms. Domke is a 35 year old female with past medical history significant for multiple sclerosis and optic neuritis; who presents with complaints of double vision for the last 2 days. Symptoms are intermittent and had been worsening. She has to cover either her right or left eye in order to not see double. Previous symptoms have happened like this in the past but these only occurred on the right eye. In the last 3 weeks the patient has experienced significant symptoms of intermittent left hip/thigh sensations as though someone is putting hot liquid on her skin thereafter reports an icy hot feeling. Symptoms last 30 seconds to a minute then self resolved. Her neurologist is Dr. Gaynelle Adu a [facility]. In the past she has been tried on multiple disease modifying agents including Copaxone, Betaseron, and Tecfidera. However, reported side effects of flulike symptoms and tachycardia with nausea vomiting diarrhea for which these agents ultimately had to be stopped.  Upon admission patient was evaluated with MRI which revealed a 9 mm lesion in the posterior occipital lobe suggestive of demyelinating disease. Neurology was consulted and recommended admission to the [facility].  Review of Systems  Constitutional: Negative for fever, chills and malaise/fatigue.  HENT: Negative for hearing loss and tinnitus.   Eyes: Positive for double vision.  Respiratory: Negative for hemoptysis and sputum production.   Cardiovascular: Negative for chest pain and palpitations.  Gastrointestinal: Positive for nausea.  Genitourinary: Negative for urgency and frequency.  Musculoskeletal: Negative for back pain and neck pain.  Skin: Negative for itching and rash.  Neurological:  Positive for dizziness and sensory change.  Psychiatric/Behavioral: Negative for hallucinations and substance abuse.       Past Medical History  Diagnosis Date  . Multiple sclerosis (HCC)   . Optic neuritis      Past Surgical History  Procedure Laterality Date  . Ovary surgery        Social History:  reports that she has been smoking.  She does not have any smokeless tobacco history on file. She reports that she does not drink alcohol or use illicit drugs. Where does patient live--home  and with whom if at home? Can patient participate in ADLs? Yes  No Known Allergies  No family history on file.     Prior to Admission medications   Not on File     Physical Exam: Filed Vitals:   12/07/15 2012  BP: 111/80  Pulse: 80  Temp: 98.2 F (36.8 C)  TempSrc: Oral  Resp: 16  Height:  (1.626 m)  Weight: 60.328 kg (133 lb)  SpO2: 100%     Constitutional: Vital signs reviewed. Patient is a well-developed and well-nourished in no acute distress and cooperative with exam. Alert and oriented x3.  Head: Normocephalic and atraumatic  Ear: TM normal bilaterally  Mouth: no erythema or exudates, MMM  Eyes: PERRL. Diplopia with both eyes open. Visual field improved with covering 1 eye Neck: Supple, Trachea midline normal ROM, No JVD, mass, thyromegaly, or carotid bruit present.  Cardiovascular: RRR, S1 normal, S2 normal, no MRG, pulses symmetric and intact bilaterally  Pulmonary/Chest: CTAB, no wheezes, rales, or rhonchi  Abdominal: Soft. Non-tender, non-distended, bowel sounds are normal, no masses, organomegaly, or guarding present.  GU: no CVA tenderness Musculoskeletal: No joint deformities, erythema,  or stiffness, ROM full and no nontender Ext: no edema and no cyanosis, pulses palpable bilaterally (DP and PT)  Hematology: no cervical, inginal, or axillary adenopathy.  Neurological: A&O x3, Strenght is normal and symmetric bilaterally, cranial nerve II-XII are grossly  intact, no focal motor deficit, sensory intact to light touch bilaterally.  Skin: Warm, dry and intact. No rash, cyanosis, or clubbing.  Psychiatric: Normal mood and affect. speech and behavior is normal. Judgment and thought content normal. Cognition and memory are normal.      Data Review   Micro Results No results found for this or any previous visit (from the past 240 hour(s)).  Radiology Reports Mr Laqueta Jean Wo Contrast  12/07/2015  CLINICAL DATA:  Initial evaluation for acute diplopia. History of multiple sclerosis. EXAM: MRI HEAD WITHOUT AND WITH CONTRAST TECHNIQUE: Multiplanar, multiecho pulse sequences of the brain and surrounding structures were obtained without and with intravenous contrast. CONTRAST:  12 cc of MultiHance. COMPARISON:  None. FINDINGS: Diffusion-weighted imaging demonstrates no evidence for acute infarct. Gray-white matter differentiation maintained. Major intracranial vascular flow voids are preserved. No acute or chronic intracranial hemorrhage. No areas of chronic infarction. Multiple scattered T2/FLAIR hyperintense foci present within the periventricular, deep, and subcortical white matter both cerebral hemispheres in a distribution most compatible with underlying demyelinating disease. Scattered infratentorial lesions present as well, most prevalent at the inferior aspect of the pons. Many of the periventricular foci oriented perpendicular to the lateral ventricles. Findings most compatible with provided history of multiple sclerosis. There is a prominent 9 mm enhancing lesion within the deep white matter of the left occipital lobe, consistent with active demyelination. Associated diffusion signal abnormality present within this lesion. No other definite evidence for active demyelination. No mass lesion, midline shift, or mass effect. No hydrocephalus. No extra-axial fluid collection. Major dural sinuses are grossly patent. Craniocervical junction normal. Pituitary gland  within normal limits. No acute abnormality about the orbits. No definite evidence for active nor optic neuritis on this exam. Paranasal sinuses are clear. No mastoid effusion. Inner ear structures normal. Bone marrow signal intensity within normal limits. No scalp soft tissue abnormality. IMPRESSION: Multiple T2/FLAIR hyperintense foci involving the supratentorial and infratentorial brain as above, compatible with provided history of multiple sclerosis. 9 mm enhancing lesion within the left occipital lobe consistent with active demyelination. Electronically Signed   By: Rise Mu M.D.   On: 12/07/2015 23:39     CBC  Recent Labs Lab 12/07/15 2037  WBC 11.8*  HGB 13.7  HCT 40.9  PLT 280  MCV 90.3  MCH 30.2  MCHC 33.5  RDW 13.0  LYMPHSABS 4.4*  MONOABS 0.6  EOSABS 0.3  BASOSABS 0.1    Chemistries   Recent Labs Lab 12/07/15 2037  NA 140  K 3.7  CL 101  CO2 26  GLUCOSE 111*  BUN 13  CREATININE 0.64  CALCIUM 9.5   ------------------------------------------------------------------------------------------------------------------ estimated creatinine clearance is 85.6 mL/min (by C-G formula based on Cr of 0.64). ------------------------------------------------------------------------------------------------------------------ No results for input(s): HGBA1C in the last 72 hours. ------------------------------------------------------------------------------------------------------------------ No results for input(s): CHOL, HDL, LDLCALC, TRIG, CHOLHDL, LDLDIRECT in the last 72 hours. ------------------------------------------------------------------------------------------------------------------ No results for input(s): TSH, T4TOTAL, T3FREE, THYROIDAB in the last 72 hours.  Invalid input(s): FREET3 ------------------------------------------------------------------------------------------------------------------ No results for input(s): VITAMINB12, FOLATE, FERRITIN,  TIBC, IRON, RETICCTPCT in the last 72 hours.  Coagulation profile No results for input(s): INR, PROTIME in the last 168 hours.  No results for input(s): DDIMER in the last 72 hours.  Cardiac Enzymes No results for input(s): CKMB, TROPONINI, MYOGLOBIN in the last 168 hours.  Invalid input(s): CK ------------------------------------------------------------------------------------------------------------------ Invalid input(s): POCBNP   CBG: No results for input(s): GLUCAP in the last 168 hours.     EKG: Independently reviewed.   Assessment/Plan  Acute MS Relapsing and remitting with diplopia: Patient presented with acute symptoms of double vision. Found to have a 9 mm lesion in the posterior occipital lobe on MRI. - Admit to MedSurg bed - Neurology recommended   - 1 g of Solu-Medrol every 24 hours  - Reglan every 6 hours as needed for nausea  - Valium 5mg  prn vertigo  - Left eye patch  - Follow up with neurology for further recommendations  Leukocytosis: WBC elevated at 11.8 on admission. - Checking urinalysis  Code Status:   full Family Communication: bedside Disposition Plan: admit   Total time spent 55 minutes.Greater than 50% of this time was spent in counseling, explanation of diagnosis, planning of further management, and coordination of care  Clydie Braun [facility] Pager [phone]  If 7PM-7AM, please contact night-coverage [email or web address] Password [email or web address] 12/08/2015, 12:30 AM

## 2015-12-08 NOTE — Progress Notes (Signed)
Occupational Therapy Evaluation Patient Details Name: Lisa Wu MRN: [id number] DOB: [birth year] Today's Date: 12/08/2015    History of Present Illness 35 year old female patient with history of multiple sclerosis, followed at [facility], presented to ED on 12/07/15 with complaints of double vision.MRI brain showed new left occipital enhancing lesion. Admitted for acute MS flare. Neurology consulted and patient on pulse Solu-Medrol.   Clinical Impression   PTA, pt independent with ADL and mobility. Pt presents with c/o double vision (lateral). Pt has tried patch, but she doesn't like it. Assessed with use of partial occlusion at nasal field of non-dominant eye. Completed education regarding use of partial occlusion in conjunction with patch if needed. See below. Pt/husband verbalized understanding. Discussed use of partial occlusion with Dr. Waymon Amato.     Follow Up Recommendations  Other (comment) (optometrist if diplopia does not resolve)    Equipment Recommendations  None recommended by OT    Recommendations for Other Services       Precautions / Restrictions Precautions Precautions: Fall      Mobility Bed Mobility Overal bed mobility: Independent                Transfers Overall transfer level: Independent                    Balance                                            ADL Overall ADL's : At baseline                                       General ADL Comments: Pt able to comlete basic ADL tasks. Has difficulty with IADL tasks due to diplopia.   Recommended for pt to sit when showering adn to use handrail whenever going up/down stairs.  Discussed other management strategies for MS related symptoms.     Vision Vision Assessment?: Yes Eye Alignment: Within Functional Limits Ocular Range of Motion: Within Functional Limits Alignment/Gaze Preference: Within Defined Limits Tracking/Visual Pursuits: Decreased  smoothness of horizontal tracking Saccades: Additional head turns occurred during testing Convergence: Within functional limits Visual Fields: No apparent deficits Diplopia Assessment: Disappears with one eye closed;Present in far gaze;Objects split side to side  . Pt appears to have difficulty with accomodation. Partial occlusion used on nasal field of non-dominant eye. Pt with reduced c/o double vision with R lateral vision. Pt reports L lateral vision impaired, but better. Discussed use of glasses for functional tasks and watching TV. If ambulating pt can use patch to help with L lateral vision. Pt verbalized understanding. Husband/pt verbalized understanding of technique used to tape lens.    Perception     Praxis      Pertinent Vitals/Pain Pain Assessment: No/denies pain     Hand Dominance     Extremity/Trunk Assessment Upper Extremity Assessment Upper Extremity Assessment: Overall WFL for tasks assessed   Lower Extremity Assessment Lower Extremity Assessment: Overall WFL for tasks assessed   Cervical / Trunk Assessment Cervical / Trunk Assessment: Normal   Communication Communication Communication: No difficulties   Cognition Arousal/Alertness: Awake/alert Behavior During Therapy: WFL for tasks assessed/performed Overall Cognitive Status: Within Functional Limits for tasks assessed  General Comments       Exercises       Shoulder Instructions      Home Living Family/patient expects to be discharged to:: Private residence Living Arrangements: Spouse/significant other Available Help at Discharge: Family;Available 24 hours/day Type of Home: House Home Access: Stairs to enter [employer] of Steps: 3 Entrance Stairs-Rails: Right Home Layout: One level     Bathroom Shower/Tub: Producer, television/film/video: Standard Bathroom Accessibility: Yes   Home Equipment: Shower seat          Prior Functioning/Environment  Level of Independence: Independent        Comments: drives; cleans houses    OT Diagnosis: Disturbance of vision   OT Problem List: Impaired vision/perception   OT Treatment/Interventions:      OT Goals(Current goals can be found in the care plan section) Acute Rehab OT Goals Patient Stated Goal: [place] home OT Goal Formulation: With patient  OT Frequency:     Barriers to D/C:            Co-evaluation              End of Session Nurse Communication: Mobility status  Activity Tolerance: Patient tolerated treatment well Patient left: in bed;with call bell/phone within reach;with family/visitor present   Time: 1420-1445 OT Time Calculation (min): 25 min Charges:  OT General Charges $OT Visit: 1 Procedure OT Evaluation $OT Eval Moderate Complexity: 1 Procedure OT Treatments $Therapeutic Activity: 8-22 mins G-Codes: OT G-codes **NOT FOR INPATIENT CLASS** Functional Assessment Tool Used: clinical judgement Functional Limitation: Self care Self Care Current Status (Z6109): At least 1 percent but less than 20 percent impaired, limited or restricted Self Care Goal Status (U0454): At least 1 percent but less than 20 percent impaired, limited or restricted Self Care Discharge Status [phone]): At least 1 percent but less than 20 percent impaired, limited or restricted  Jameer Storie,HILLARY 12/08/2015, 3:02 PM   [facility], OTR/L  [phone] 12/08/2015

## 2015-12-08 NOTE — Progress Notes (Signed)
PROGRESS NOTE    LUCELLA POMMIER  UEA:[id number]  DOB: 1981-08-16  DOA: 12/07/2015 PCP: No primary care provider on file. Outpatient Specialists: Neurology: Dr. Gaynelle Adu a [facility] course: 35 year old female patient with history of multiple sclerosis, followed at [facility], presented to ED on 12/07/15 with complaints of double vision. She called her neurologist and advised to go to an ophthalmologist but could not get an appointment to see one and hence presented to ED. MRI brain showed new left occipital enhancing lesion. Admitted for acute MS flare. Neurology consulted and patient on pulse Solu-Medrol.   Assessment & Plan:   Multiple sclerosis relapse with diplopia - Patient and spouse indicated that she has had multiple flareups over the last year after being quincent for almost 5 years. - Neurology consultation appreciated and patient has been started on IV Solu-Medrol. Patient reports no change in symptoms. Would like to monitor her for improvement prior to discharging home to complete 5 days of IV steroids at home which she has done via peripheral IV in the past. - Discussed with neurology. We will get case management consultation to arrange for home IV Solu-Medrol for possible discharge on 3/18.     DVT prophylaxis: Enoxaparin  Code Status: Full  Family Communication: Discussed with spouse at bedside on 3/17.  Disposition Plan: DC home when medically stable, possibly 3/18  Consultants:  Neurology   Procedures:  None   Antimicrobials:  None   Subjective: No change in double vision. Denies headache, tingling, numbness or asymmetrical weakness. No dizziness reported.  Objective: Filed Vitals:   12/07/15 2012 12/08/15 0107 12/08/15 0202 12/08/15 0640  BP: 111/80 99/73 105/52 95/54  Pulse: 80 70 74 80  Temp: 98.2 F (36.8 C)  98.4 F (36.9 C) 98.4 F (36.9 C)  TempSrc: Oral  Oral Oral  Resp: Height:   (1.626 m)   (1.626 m)   Weight: 60.328 kg (133 lb)  59.8 kg (131 lb 13.4 oz)   SpO2: 100% 98% 99% 99%    Intake/Output Summary (Last 24 hours) at 12/08/15 1200 Last data filed at 12/08/15 0900  Gross per 24 hour  Intake    240 ml  Output      0 ml  Net    240 ml   Filed Weights   12/07/15 2012 12/08/15 0202  Weight: 60.328 kg (133 lb) 59.8 kg (131 lb 13.4 oz)    Exam:  General exam: Pleasant young female lying comfortably supine in bed.  Respiratory system: Clear. No increased work of breathing. Cardiovascular system: S1 & S2 heard, RRR. No JVD, murmurs, gallops, clicks or pedal edema. Gastrointestinal system: Abdomen is nondistended, soft and nontender. Normal bowel sounds heard. Central nervous system: Alert and oriented. Horizontal diplopia on lateral eye movements. No other focal neurological deficits.  Extremities: Symmetric 5 x 5 power.   Data Reviewed: Basic Metabolic Panel:  Recent Labs Lab 12/07/15 2037  NA 140  K 3.7  CL 101  CO2 26  GLUCOSE 111*  BUN 13  CREATININE 0.64  CALCIUM 9.5   Liver Function Tests: No results for input(s): AST, ALT, ALKPHOS, BILITOT, PROT, ALBUMIN in the last 168 hours. No results for input(s): LIPASE, AMYLASE in the last 168 hours. No results for input(s): AMMONIA in the last 168 hours. CBC:  Recent Labs Lab 12/07/15 2037 12/08/15 0540  WBC 11.8* 10.0  NEUTROABS 6.5  --   HGB 13.7 13.3  HCT 40.9 40.1  MCV 90.3 89.1  PLT 280 262   Cardiac Enzymes: No results for input(s): CKTOTAL, CKMB, CKMBINDEX, TROPONINI in the last 168 hours. BNP (last 3 results) No results for input(s): PROBNP in the last 8760 hours. CBG: No results for input(s): GLUCAP in the last 168 hours.  No results found for this or any previous visit (from the past 240 hour(s)).       Studies: Mr Lodema Pilot Contrast  21-Dec-2015  CLINICAL DATA:  Initial evaluation for acute diplopia. History of multiple sclerosis. EXAM: MRI HEAD WITHOUT AND  WITH CONTRAST TECHNIQUE: Multiplanar, multiecho pulse sequences of the brain and surrounding structures were obtained without and with intravenous contrast. CONTRAST:  12 cc of MultiHance. COMPARISON:  None. FINDINGS: Diffusion-weighted imaging demonstrates no evidence for acute infarct. Gray-white matter differentiation maintained. Major intracranial vascular flow voids are preserved. No acute or chronic intracranial hemorrhage. No areas of chronic infarction. Multiple scattered T2/FLAIR hyperintense foci present within the periventricular, deep, and subcortical white matter both cerebral hemispheres in a distribution most compatible with underlying demyelinating disease. Scattered infratentorial lesions present as well, most prevalent at the inferior aspect of the pons. Many of the periventricular foci oriented perpendicular to the lateral ventricles. Findings most compatible with provided history of multiple sclerosis. There is a prominent 9 mm enhancing lesion within the deep white matter of the left occipital lobe, consistent with active demyelination. Associated diffusion signal abnormality present within this lesion. No other definite evidence for active demyelination. No mass lesion, midline shift, or mass effect. No hydrocephalus. No extra-axial fluid collection. Major dural sinuses are grossly patent. Craniocervical junction normal. Pituitary gland within normal limits. No acute abnormality about the orbits. No definite evidence for active nor optic neuritis on this exam. Paranasal sinuses are clear. No mastoid effusion. Inner ear structures normal. Bone marrow signal intensity within normal limits. No scalp soft tissue abnormality. IMPRESSION: Multiple T2/FLAIR hyperintense foci involving the supratentorial and infratentorial brain as above, compatible with provided history of multiple sclerosis. 9 mm enhancing lesion within the left occipital lobe consistent with active demyelination. Electronically  Signed   By: Rise Mu M.D.   On: 2015/12/21 23:39        Scheduled Meds: . enoxaparin (LOVENOX) injection  40 mg Subcutaneous Daily  . metoCLOPramide (REGLAN) injection  10 mg Intravenous 4 times per day  . pantoprazole  40 mg Oral BID  . sodium chloride flush  3 mL Intravenous Q12H   Continuous Infusions:   Principal Problem:   Multiple sclerosis, relapsing-remitting (HCC) Active Problems:   Double vision   Leukocytosis    Time spent: 25 minutes.    Marcellus Scott, MD, FACP, FHM. [facility] Pager [phone]  If 7PM-7AM, please contact night-coverage [email or web address] Password [email or web address] 12/08/2015, 12:00 PM    LOS: 0 days

## 2015-12-08 NOTE — Care Management Note (Addendum)
Case Management Note  Patient Details  Name: Lisa Wu MRN: [id number] Date of Birth: [birth year]  Subjective/Objective:                    Action/Plan:  Confirmed face sheet information with patient . Patient no longer has home phone . Mobile number is correct.  MD would like OT to see patient before she is discharged today . Called OT office and they are aware and stated they will send OT to eval patient .  Patient and family aware .  MD wants patient to have eye patch for home. Bedside nurse has eye gauze and tape . Patient states tape irrigates she. Family will stop at [facility] and buy plastic eye patch.  Patient has [employer] . Called [facility] to arrange home health RN and infusion . Spoke with Marchelle Folks received authorization for [facility] to provide [facility] and infusion services. Intake ID number [id number] . Information / orders faxed to [facility] phone [phone] fax [phone] and [phone].  Patient aware and in agreement.  Expected Discharge Date:                  Expected Discharge Plan:  Home w Home Health Services  In-House Referral:     Discharge planning Services  CM Consult  Post Acute Care Choice:  Home Health Choice offered to:  Patient  DME Arranged:    DME Agency:     HH Arranged:  RN HH Agency:  [facility]  Status of Service:  Completed, signed off  Medicare Important Message Given:    Date Medicare IM Given:    Medicare IM give by:    Date Additional Medicare IM Given:    Additional Medicare Important Message give by:     If discussed at Long Length of Stay Meetings, dates discussed:    Additional Comments:  Kingsley Plan, RN 12/08/2015, 1:49 PM

## 2015-12-08 NOTE — Discharge Summary (Signed)
Physician Discharge Summary  Lisa Wu  ZOX:[id number]  DOB: 1981/09/22  DOA: 12/07/2015  PCP: No primary care provider on file.  Neurology: Dr. Gaynelle Adu a [facility]  Admit date: 12/07/2015 Discharge date: 12/08/2015  Time spent: Less than 30 minutes  Recommendations for Outpatient Follow-up:  1. Dr. Aretha Parrot, Neurology in 3 days.  Discharge Diagnoses:  Principal Problem:   Multiple sclerosis, relapsing-remitting (HCC) Active Problems:   Double vision   Leukocytosis   Discharge Condition: Stable  Diet recommendation: Regular diet  Filed Weights   12/07/15 2012 12/08/15 0202  Weight: 60.328 kg (133 lb) 59.8 kg (131 lb 13.4 oz)    History of present illness & hospital course:  35 year old female patient with history of multiple sclerosis, followed at [facility], presented to ED on 12/07/15 with complaints of double vision. She called her neurologist and advised to go to an ophthalmologist but could not get an appointment to see one and hence presented to ED. MRI brain showed new left occipital enhancing lesion. Admitted for acute MS flare. Neurology consulted and patient on pulse Solu-Medrol. She received a dose of Solumedrol 1 gm at 2 am today. No significant change in diplopia. She is anxious to go home. Neurology has seen and recommend she can complete 5 days course of Solumedrol at home which has been arranged by CM with Home Health agency. Discussed at length with Neurologist who indicates that her symptoms may take few days to improve. Discussed with patient and advised she follow up with her Neurologist early next week and also advised her not to drive until her symptoms resolve. She verbalized understanding. Patient and spouse indicated that she has had multiple flareups over the last year after being quincent for almost 5 years. Urine pregnancy test: Neg.  Consultations:  Neurology  Procedures:  None   Discharge  Exam:  Complaints: No change in double vision. Denies headache, tingling, numbness or asymmetrical weakness. No dizziness reported.  Filed Vitals:   12/07/15 2012 12/08/15 0107 12/08/15 0202 12/08/15 0640  BP: 111/80 99/73 105/52 95/54  Pulse: 80 70 74 80  Temp: 98.2 F (36.8 C)  98.4 F (36.9 C) 98.4 F (36.9 C)  TempSrc: Oral  Oral Oral  Resp: 16 16 18 18   Height: 5\' 4"  (1.626 m)  5\' 4"  (1.626 m)   Weight: 60.328 kg (133 lb)  59.8 kg (131 lb 13.4 oz)   SpO2: 100% 98% 99% 99%    General exam: Pleasant young female lying comfortably supine in bed.  Respiratory system: Clear. No increased work of breathing. Cardiovascular system: S1 & S2 heard, RRR. No JVD, murmurs, gallops, clicks or pedal edema. Gastrointestinal system: Abdomen is nondistended, soft and nontender. Normal bowel sounds heard. Central nervous system: Alert and oriented. Horizontal diplopia on lateral eye movements. No other focal neurological deficits.  Extremities: Symmetric 5 x 5 power.  Discharge Instructions      Discharge Instructions    Activity as tolerated - No restrictions    Complete by:  As directed      Call MD for:  difficulty breathing, headache or visual disturbances    Complete by:  As directed      Call MD for:  extreme fatigue    Complete by:  As directed      Call MD for:  persistant dizziness or light-headedness    Complete by:  As directed      Call MD for:  persistant nausea and vomiting  Complete by:  As directed      Call MD for:  severe uncontrolled pain    Complete by:  As directed      Call MD for:  temperature >100.4    Complete by:  As directed      Call MD for:    Complete by:  As directed   Worsening visual symptoms or new symptoms related to your Multiple Sclerosis.     Diet general    Complete by:  As directed             Medication List    TAKE these medications        aspirin-acetaminophen-caffeine 250-250-65 MG tablet  Commonly known as:  EXCEDRIN  MIGRAINE  Take 1-2 tablets by mouth every 6 (six) hours as needed for headache.     methylPREDNISolone sodium succinate 500 mg in sodium chloride 0.9 % 50 mL  Inject 500 mg into the vein 2 (two) times daily. Complete 8 doses beginning 12/09/2015 morning.  Start taking on:  12/09/2015     metroNIDAZOLE 500 MG tablet  Commonly known as:  FLAGYL  Take 500 mg by mouth See admin instructions. Take 1 tablet daily for 7 days, then take 2 tablets once a week for 12-13 weeks     pantoprazole 40 MG tablet  Commonly known as:  PROTONIX  Take 1 tablet (40 mg total) by mouth 2 (two) times daily.       Follow-up Information    Follow up with ABOU-ZEID,NUHAD. Schedule an appointment as soon as possible for a visit in 3 days.   Specialty:  Unknown Physician Specialty   Why:  Follow up re MS relapse.      Follow up with [facility].   Why:  home health RN    Contact information:   [address] [place] [address] [phone]       Get Medicines reviewed and adjusted: Please take all your medications with you for your next visit with your Primary MD  Please request your Primary MD to go over all hospital tests and procedure/radiological results at the follow up. Please ask your Primary MD to get all Hospital records sent to his/her office.  If you experience worsening of your admission symptoms, develop shortness of breath, life threatening emergency, suicidal or homicidal thoughts you must seek medical attention immediately by calling 911 or calling your MD immediately if symptoms less severe.  You must read complete instructions/literature along with all the possible adverse reactions/side effects for all the Medicines you take and that have been prescribed to you. Take any new Medicines after you have completely understood and accept all the possible adverse reactions/side effects.   Do not drive when taking pain medications.   Do not take more than prescribed  Pain, Sleep and Anxiety Medications  Special Instructions: If you have smoked or chewed Tobacco in the last 2 yrs please stop smoking, stop any regular Alcohol and or any Recreational drug use.  Wear Seat belts while driving.  Please note  You were cared for by a hospitalist during your hospital stay. Once you are discharged, your primary care physician will handle any further medical issues. Please note that NO REFILLS for any discharge medications will be authorized once you are discharged, as it is imperative that you return to your primary care physician (or establish a relationship with a primary care physician if you do not have one) for your aftercare needs so that  they can reassess your need for medications and monitor your lab values.    The results of significant diagnostics from this hospitalization (including imaging, microbiology, ancillary and laboratory) are listed below for reference.    Significant Diagnostic Studies: Mr Lodema Pilot Contrast  2016/01/02  CLINICAL DATA:  Initial evaluation for acute diplopia. History of multiple sclerosis. EXAM: MRI HEAD WITHOUT AND WITH CONTRAST TECHNIQUE: Multiplanar, multiecho pulse sequences of the brain and surrounding structures were obtained without and with intravenous contrast. CONTRAST:  12 cc of MultiHance. COMPARISON:  None. FINDINGS: Diffusion-weighted imaging demonstrates no evidence for acute infarct. Gray-white matter differentiation maintained. Major intracranial vascular flow voids are preserved. No acute or chronic intracranial hemorrhage. No areas of chronic infarction. Multiple scattered T2/FLAIR hyperintense foci present within the periventricular, deep, and subcortical white matter both cerebral hemispheres in a distribution most compatible with underlying demyelinating disease. Scattered infratentorial lesions present as well, most prevalent at the inferior aspect of the pons. Many of the periventricular foci oriented  perpendicular to the lateral ventricles. Findings most compatible with provided history of multiple sclerosis. There is a prominent 9 mm enhancing lesion within the deep white matter of the left occipital lobe, consistent with active demyelination. Associated diffusion signal abnormality present within this lesion. No other definite evidence for active demyelination. No mass lesion, midline shift, or mass effect. No hydrocephalus. No extra-axial fluid collection. Major dural sinuses are grossly patent. Craniocervical junction normal. Pituitary gland within normal limits. No acute abnormality about the orbits. No definite evidence for active nor optic neuritis on this exam. Paranasal sinuses are clear. No mastoid effusion. Inner ear structures normal. Bone marrow signal intensity within normal limits. No scalp soft tissue abnormality. IMPRESSION: Multiple T2/FLAIR hyperintense foci involving the supratentorial and infratentorial brain as above, compatible with provided history of multiple sclerosis. 9 mm enhancing lesion within the left occipital lobe consistent with active demyelination. Electronically Signed   By: Rise Mu M.D.   On: 02-Jan-2016 23:39    Microbiology: No results found for this or any previous visit (from the past 240 hour(s)).   Labs: Basic Metabolic Panel:  Recent Labs Lab Jan 02, 2016 2037  NA 140  K 3.7  CL 101  CO2 26  GLUCOSE 111*  BUN 13  CREATININE 0.64  CALCIUM 9.5   Liver Function Tests: No results for input(s): AST, ALT, ALKPHOS, BILITOT, PROT, ALBUMIN in the last 168 hours. No results for input(s): LIPASE, AMYLASE in the last 168 hours. No results for input(s): AMMONIA in the last 168 hours. CBC:  Recent Labs Lab 01-02-16 2037 12/08/15 0540  WBC 11.8* 10.0  NEUTROABS 6.5  --   HGB 13.7 13.3  HCT 40.9 40.1  MCV 90.3 89.1  PLT 280 262   Cardiac Enzymes: No results for input(s): CKTOTAL, CKMB, CKMBINDEX, TROPONINI in the last 168  hours. BNP: BNP (last 3 results) No results for input(s): BNP in the last 8760 hours.  ProBNP (last 3 results) No results for input(s): PROBNP in the last 8760 hours.  CBG: No results for input(s): GLUCAP in the last 168 hours.     Signed:  Marcellus Scott, MD, FACP, FHM. [facility] Pager [phone]  If 7PM-7AM, please contact night-coverage [email or web address] Password [email or web address] 12/08/2015, 1:52 PM

## 2015-12-08 NOTE — Progress Notes (Signed)
Discussed with patient the ability to have home health go to her house and she finishes the rest of her Solumedrol at home. Patient would very much like this. IF sent home Dr. Lavon Paganini would like her to have Solumedrol 500 mg BID for total of 8 doses. Will need to remain on Protonix for gut protection. Will need social work to get this set up.   Felicie Morn PA-C [facility] [phone]  12/08/2015, 12:18 PM   Seen with Dr. Lavon Paganini. Please see his attestation note for A/P for any additional work up recommendations.

## 2015-12-08 NOTE — ED Notes (Signed)
Pt moved in to room with bed and provided with beverage, per request.

## 2015-12-09 LAB — HEMOGLOBIN A1C
HEMOGLOBIN A1C: 5.4 % (ref 4.8–5.6)
Mean Plasma Glucose: 108 mg/dL

## 2016-01-26 ENCOUNTER — Other Ambulatory Visit: Payer: Self-pay | Admitting: Obstetrics and Gynecology

## 2016-01-31 LAB — CYTOLOGY - PAP

## 2017-01-03 ENCOUNTER — Encounter: Payer: Self-pay | Admitting: *Deleted

## 2017-01-06 ENCOUNTER — Ambulatory Visit: Payer: Managed Care, Other (non HMO) | Admitting: Diagnostic Neuroimaging

## 2017-01-07 ENCOUNTER — Telehealth: Payer: Self-pay | Admitting: *Deleted

## 2017-01-07 NOTE — Telephone Encounter (Signed)
LMVM for pt to return call to reschedule appt. 

## 2017-02-15 IMAGING — MR MR HEAD WO/W CM
12 of 17 series · 22 of 48 positions shown · IV contrast (multihance)
Comparison: None.

CLINICAL DATA: Initial evaluation for acute diplopia. History of
multiple sclerosis.

EXAM:
MRI HEAD WITHOUT AND WITH CONTRAST
TECHNIQUE: Multiplanar, multiecho pulse sequences of the brain and surrounding
structures were obtained without and with intravenous contrast.
CONTRAST:  12 cc of MultiHance.

[Series 2: FLAIR · sagittal · 5.0mm · 0.47mm/px · 1 of 23 slices shown (1 of 3)]
[im 1/23]
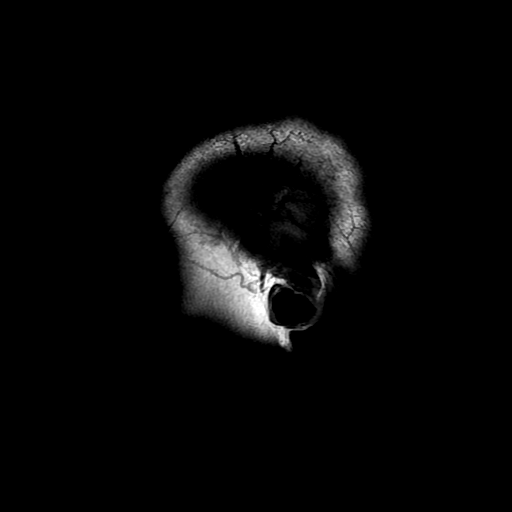

[Series 3: FLAIR · sagittal · 1.6mm · 0.49mm/px · 7 of 230 slices shown (2 of 3)]
[im 1/230]
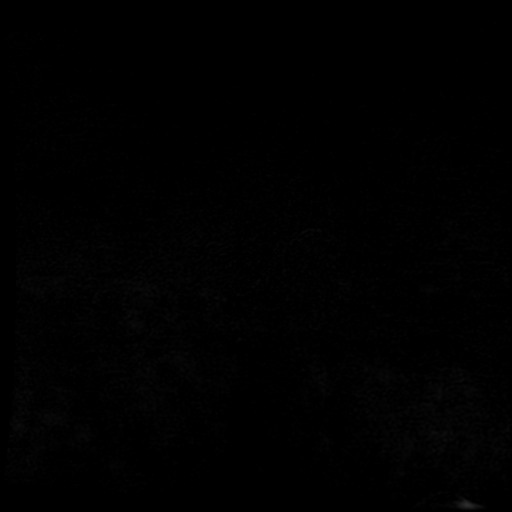
[im 39/230]
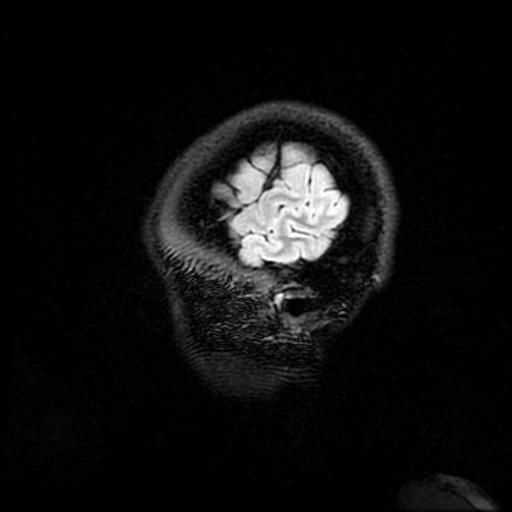
[im 77/230]
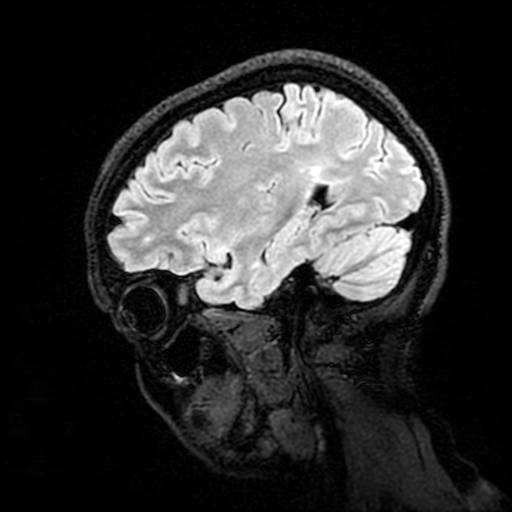
[im 115/230]
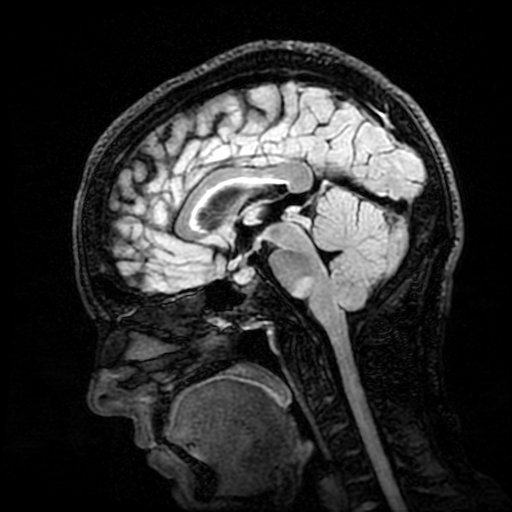
[im 153/230]
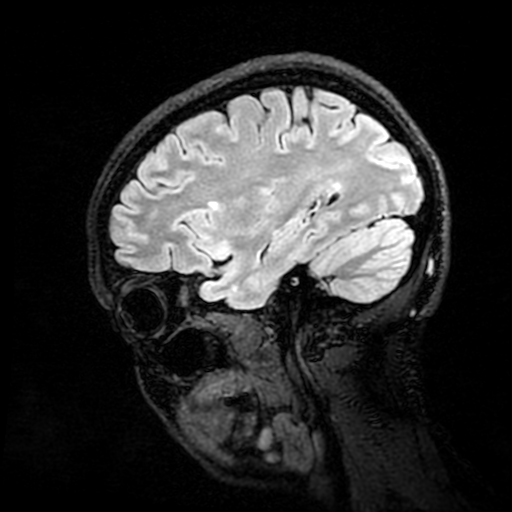
[im 191/230]
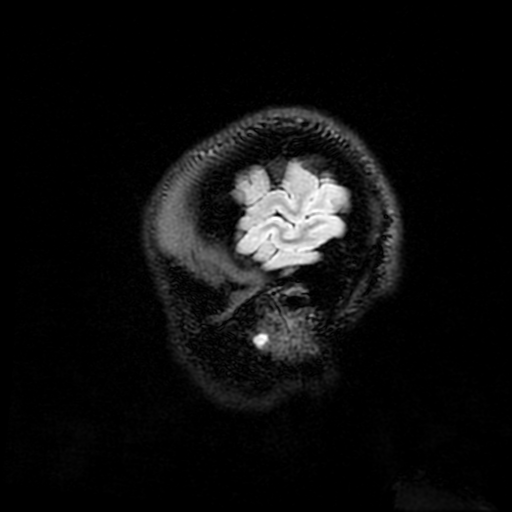
[im 230/230]
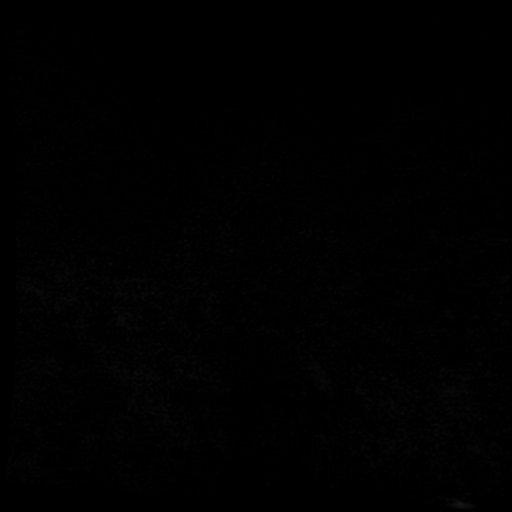

[Series 5: DWI · axial · 4.5mm · 0.94mm/px · 1 of 68 slices shown (1 of 4)]
[im 1/68]
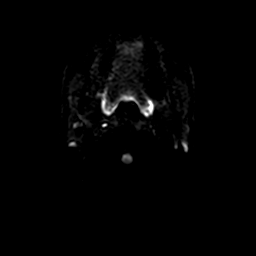

[Series 6: DWI · coronal · 5.0mm · 0.94mm/px · 2 of 64 slices shown (2 of 4)]
[im 1/64]
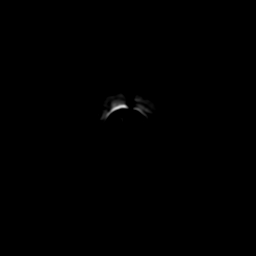
[im 64/64]
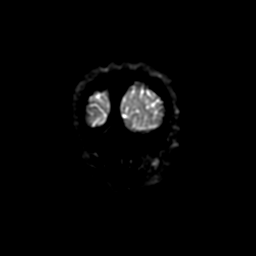

[Series 7: T2 · axial · 5.0mm · 0.47mm/px · 1 of 26 slices shown (1 of 2)]
[im 1/26]
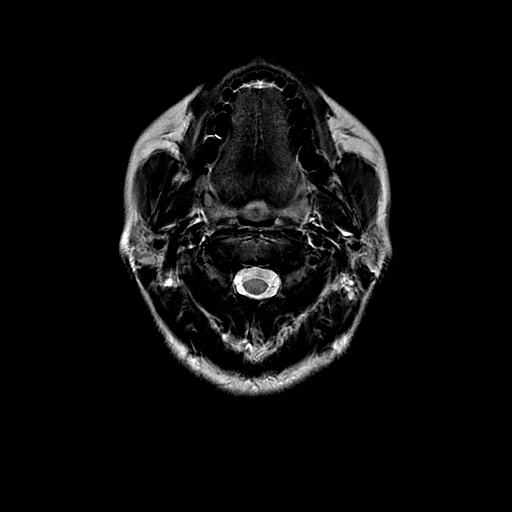

[Series 8: (person_name) · axial · 3.0mm · 0.47mm/px · z∈[-109,+33]mm · 4 of 104 slices shown]
[im 1/104]
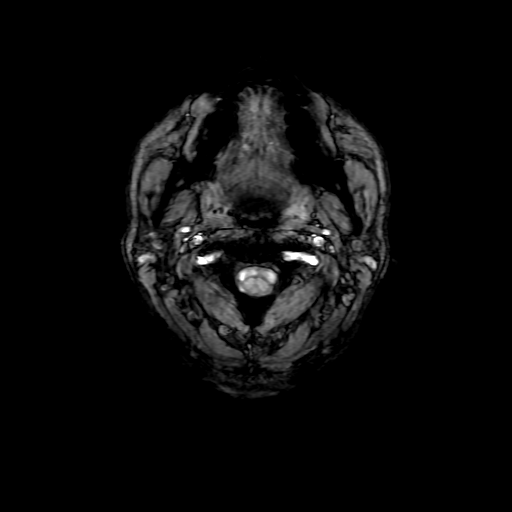
[im 35/104]
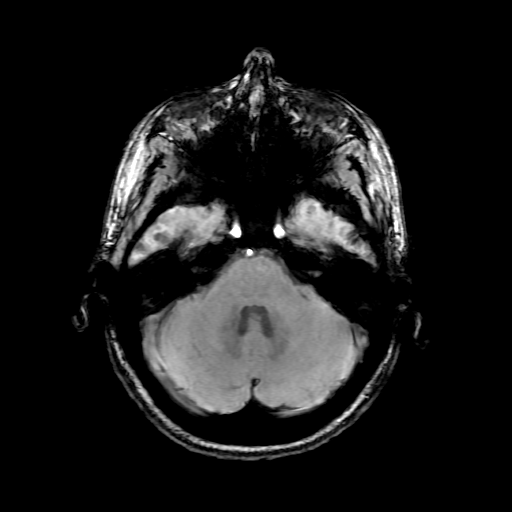
[im 69/104]
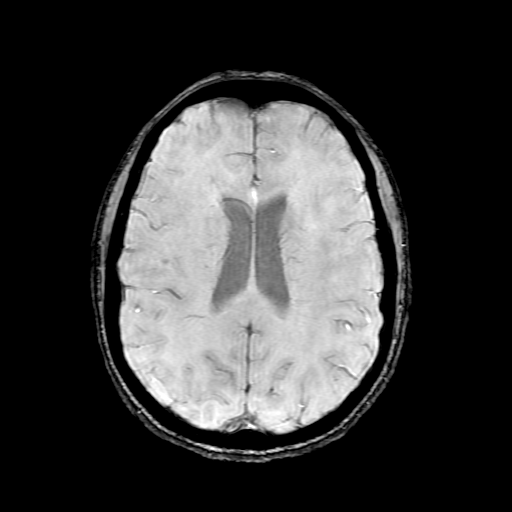
[im 104/104]
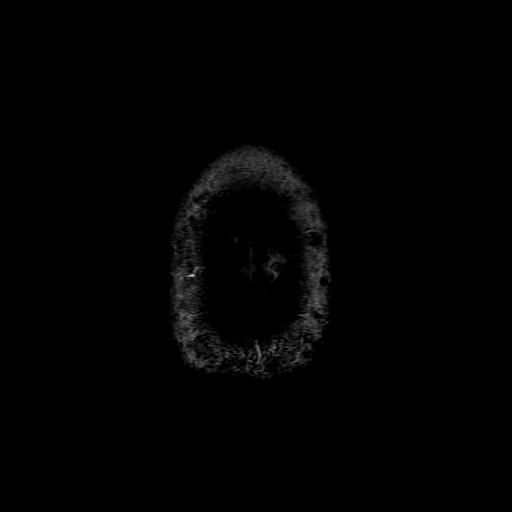

[Series 9: FLAIR · axial · 5.0mm · 0.47mm/px · 1 of 26 slices shown (3 of 3)]
[im 1/26]
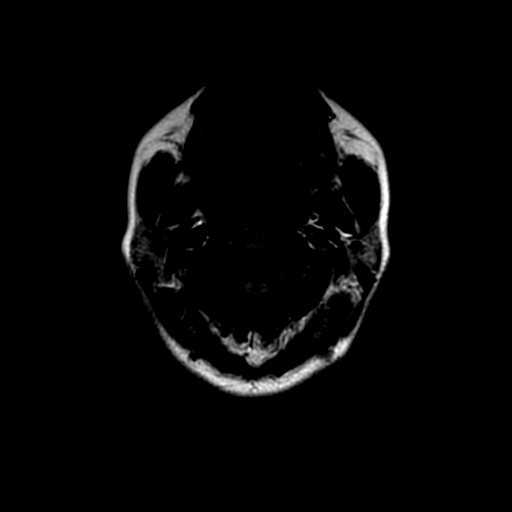

[Series 11: T2 · coronal · 5.0mm · 0.39mm/px · 1 of 27 slices shown (2 of 2)]
[im 1/27]
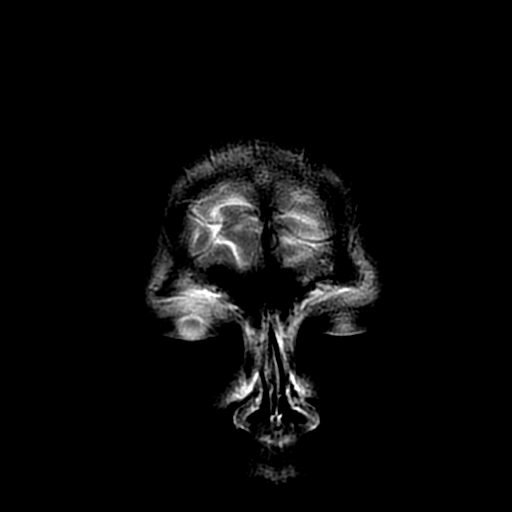

[Series 13: T1 · coronal · 5.0mm · 0.39mm/px · 1 of 27 slices shown]
[im 1/27]
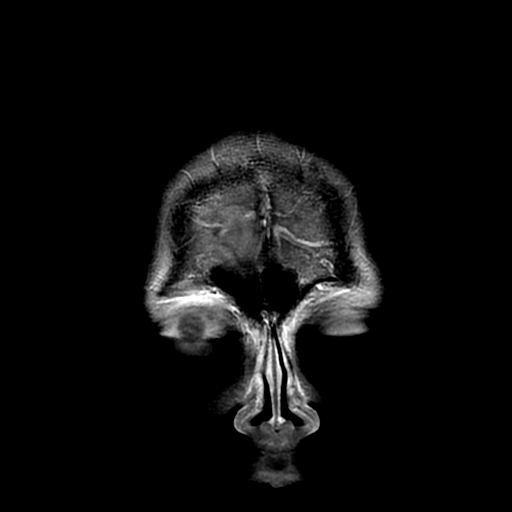

[Series 300: multiplanar reconstruction (mpr) · axial · 1.0mm · 0.49mm/px · 1 of 256 slices shown]
[im 1/256]
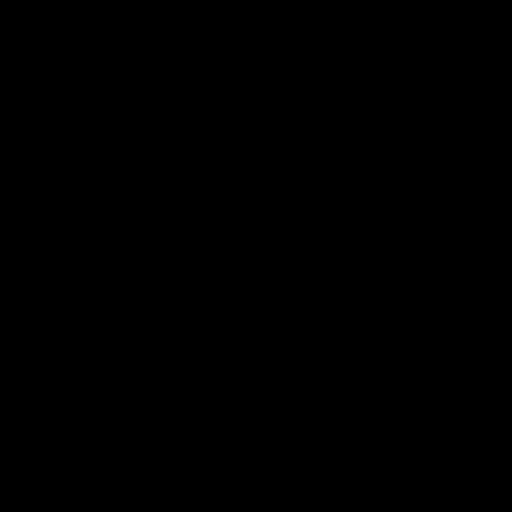

[Series 500: DWI · axial · 4.5mm · 0.94mm/px · 1 of 34 slices shown (3 of 4)]
[im 1/34]
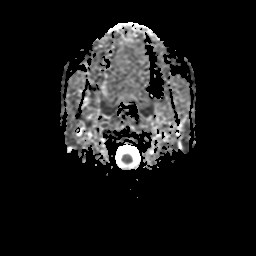

[Series 600: DWI · coronal · 5.0mm · 0.94mm/px · 1 of 32 slices shown (4 of 4)]
[im 1/32]
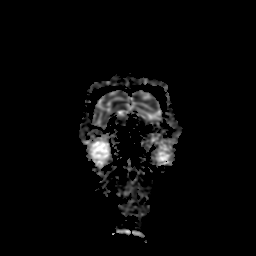

[22 of 48 positions shown; findings below may reference images not displayed]

FINDINGS: Diffusion-weighted imaging demonstrates no evidence for acute
infarct. Gray-white matter differentiation maintained. Major
intracranial vascular flow voids are preserved. No acute or chronic
intracranial hemorrhage. No areas of chronic infarction.

Multiple scattered T2/FLAIR hyperintense foci present within the
periventricular, deep, and subcortical white matter both cerebral
hemispheres in a distribution most compatible with underlying
demyelinating disease. Scattered infratentorial lesions present as
well, most prevalent at the inferior aspect of the pons. Many of the
periventricular foci oriented perpendicular to the lateral
ventricles. Findings most compatible with provided history of
multiple sclerosis. There is a prominent 9 mm enhancing lesion
within the deep white matter of the left occipital lobe, consistent
with active demyelination. Associated diffusion signal abnormality
present within this lesion. No other definite evidence for active
demyelination.

No mass lesion, midline shift, or mass effect. No hydrocephalus. No
extra-axial fluid collection. Major dural sinuses are grossly
patent.

Craniocervical junction normal.

Pituitary gland within normal limits. No acute abnormality about the
orbits. No definite evidence for active nor optic neuritis on this
exam.

Paranasal sinuses are clear. No mastoid effusion. Inner ear
structures normal.

Bone marrow signal intensity within normal limits. No scalp soft
tissue abnormality.
IMPRESSION: Multiple T2/FLAIR hyperintense foci involving the supratentorial and
infratentorial brain as above, compatible with provided history of
multiple sclerosis. 9 mm enhancing lesion within the left occipital
lobe consistent with active demyelination.

## 2018-01-14 LAB — HM HEPATITIS C SCREENING LAB: HM Hepatitis Screen: NEGATIVE

## 2021-12-06 ENCOUNTER — Encounter: Payer: Self-pay | Admitting: Neurology

## 2021-12-06 ENCOUNTER — Telehealth: Payer: Self-pay | Admitting: *Deleted

## 2021-12-06 ENCOUNTER — Ambulatory Visit: Payer: 59 | Admitting: Neurology

## 2021-12-06 ENCOUNTER — Other Ambulatory Visit: Payer: Self-pay

## 2021-12-06 VITALS — BP 112/76 | HR 70 | Ht 64.0 in | Wt 175.5 lb

## 2021-12-06 DIAGNOSIS — G35 Multiple sclerosis: Secondary | ICD-10-CM

## 2021-12-06 DIAGNOSIS — R3911 Hesitancy of micturition: Secondary | ICD-10-CM

## 2021-12-06 DIAGNOSIS — Z79899 Other long term (current) drug therapy: Secondary | ICD-10-CM | POA: Insufficient documentation

## 2021-12-06 DIAGNOSIS — R252 Cramp and spasm: Secondary | ICD-10-CM | POA: Insufficient documentation

## 2021-12-06 DIAGNOSIS — R208 Other disturbances of skin sensation: Secondary | ICD-10-CM

## 2021-12-06 DIAGNOSIS — R269 Unspecified abnormalities of gait and mobility: Secondary | ICD-10-CM

## 2021-12-06 DIAGNOSIS — R29898 Other symptoms and signs involving the musculoskeletal system: Secondary | ICD-10-CM

## 2021-12-06 DIAGNOSIS — R5383 Other fatigue: Secondary | ICD-10-CM

## 2021-12-06 MED ORDER — GABAPENTIN 300 MG PO CAPS
ORAL_CAPSULE | ORAL | 11 refills | Status: DC
Start: 2021-12-06 — End: 2022-12-18

## 2021-12-06 MED ORDER — METHYLPHENIDATE HCL 10 MG PO TABS: 10.0000 mg | ORAL_TABLET | ORAL | 0 refills | Status: DC | PRN

## 2021-12-06 NOTE — Telephone Encounter (Signed)
LVM for pt to call office back.  ? ?She missed filling out some portions on Ocrevus start form. We are also needing updated copy of insurance card (front/back). Nothing was scanned in at todays visit. We will need this to submit start form/get auth for infusion via insurance. ? ?Called and spoke w/ daughter Zollie Scale. She was w/ patient and placed phone on speaker. She just recently got new insurance. Did not have card to provide at visit today d/t not receiving yet. Husband tried logging on yesterday to insurance portal but card blank when they tried pulling up. Once she receives, she will upload copy to [employer] and send [employer] once done.  ?

## 2021-12-06 NOTE — Progress Notes (Signed)
? ?[facility] ? ?PATIENT: Lisa Wu ?DOB: 12-14-1980 ? ?REFERRING DOCTOR OR PCP: Dr. Gaynelle Adu ([facility] neurology) ?SOURCE: Patient, notes from [facility], imaging and lab reports, MRI images personally reviewed. ? ?_________________________________ ? ? ?HISTORICAL ? ?CHIEF COMPLAINT:  ?Chief Complaint  ?Patient presents with  ? New Patient (Initial Visit)  ?  Rm 1, w daughter Zollie Scale. Pt referred by Lenard Simmer, MD for [facility] for MS. On Ocrevus, missed last infusion that was sch 1st week of Feb. Has been on Ocrevus last 3 years and has been doing well. First DMT that has made her feel great and not worse.   ? ? ?HISTORY OF PRESENT ILLNESS:  ?I had the pleasure of seeing your patient, Lisa Wu, at the [facility] at [facility] for neurologic consultation regarding her multiple sclerosis. ? 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  She is currently on Ocrevus. ? ?Currently, she feels her MS is stable.   She does have dysesthesias most days that can be in different parts of her body with a stinging dysesthetic quality.    Gabapentin 600 mg po qHS has helped at night and the daytime sensations are not too annoying.   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   She fell in January and stumbles if she turns fast.    She needs to hold the bannister.   The right leg spasticity has slowly worsened.   She once had a right AFO.    She has urinary hesitancy despite also having urgency.   She often needs to go multiple times in a row.   She has never been on tamsulosin or other bladder medication.     Her vision is ok most of the time though she has occasional diplopia or visual blurring.   Her left eye seems to have more blurriness. ? ?She has fatigue daily.   Sleep is non-restorative many nights.   She snores but has not been told she has  gasps, snorts or other OSA signs.   She is both sleepy and tired during the day.   She takes a nap every mid morning but is not falling asleep watching TV, reading or at church.   She is on Ritalin prn (rarely now)  and it helps her sleepiness and to be more active, less fatigued.She denies depression.   She sometimes has an 'adrenaline sensation' with shaking and anxiety that can last minutes to hours.    She may cry with these spells.    Driving or being nervous  seems to trigger a spell.    She tries to avoid triggers.     ? ? ? ?MS history: ?In 2000, she had the onset of a Lhermitte sign radiating to the arms and into her back.  This was evaluated by neurology with an MRI and a lumbar puncture and she was diagnosed with multiple sclerosis.  She was started on Betaseron.  She stopped in 2006 for a pregnancy.  She had noted some in the ejection site reactions and flulike reactions.  In 2005 she had a relapse with double vision that was treated with IV steroids.  After the pregnancy she started Rebif but had significant flulike symptoms and injection site reactions and stopped all disease modifying therapies in 2007.  Clinically she did fairly well with intermittent transient  symptoms.  In December 2014 she developed right arm numbness and right hand up clumsiness.  She saw [facility] neurology and was treated with a 3-day course of IV Solu-Medrol with improvement that was incomplete.  Around that time a neurogenic bladder was also noted with hesitancy as well as frequency/urgency. ? ?She had relapses in June 2016 and March 2017 but was not on therapy at the time.  She had a relapse with reduced sensation in both hands and clumsiness.   She had only partial benefit She had an active MRI of the brain May 2019 and she was initiated on Ocrevus.  MRI of the brain in March 2020 was stable.   She has no new symptoms since starting Ocrevus and tolerates it very well.     Her last infusion was August 2022 and she was  supposed to get one in February but has not had it yet.    ? ?She has never been able to move her toes on the right foot.  She has severe muscle atrophy in the lower right leg.      She saw Dr, Sandria Manly many years ago.   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  ? ?MRI images personally reviewed: ?MRI of the brain 12/07/2015 showed multiple T2/STIR hyperintense foci in the periventricular, juxtacortical and deep white matter of the hemispheres.  Foci are also noted in the pons and in the right thalamus/internal capsule.  1 focus in the left parieto-occipital lobe enhanced after contrast. ? ?MRI of the brain 01/31/2018 showed T2/FLAIR hyperintense foci in the left cerebellar hemisphere, left middle cerebellar peduncle, pons, right p thalamus/osterior limb of the internal capsule and in the periventricular, juxtacortical and deep white matter of the hemispheres.  1 focus in the left frontal lobe enhanced after contrast consistent with an acute demyelinating plaque.    Additionally, compared to the 2017 MRI there were several other new foci in the hemispheres and the focus in the cerebellum was noted.\\ ? ?MRI of the cervical spine 01/31/2018 showed multiple T2 hyperintense foci located to the left adjacent to C2-C3, centrally adjacent to C4, posterolaterally to the right adjacent to C5-C6, and centrally to the right adjacent to C7 ? ?MRI of the brain 12/12/2018 was essentially unchanged compared to the MRI from 01/31/2018 showing no definite new lesions and no enhancing foci. ? ? ?REVIEW OF SYSTEMS: ?Constitutional: No fevers, chills, sweats, or change in appetite.  She has fatigue ?Eyes: No visual changes, double vision, eye pain ?Ear, nose and throat: No hearing loss, ear pain, nasal congestion, sore throat ?Cardiovascular: No chest pain, palpitations ?Respiratory:  No shortness of breath at rest or with exertion.   No wheezes ?GastrointestinaI: No nausea, vomiting, diarrhea, abdominal pain, fecal  incontinence ?Genitourinary: She has urinary hesitancy as well as urgency. ?Musculoskeletal:  No neck pain, back pain ?Integumentary: No rash, pruritus, skin lesions ?Neurological: as above ?Psychiatric: No depression at this time.  No anxiety ?Endocrine: No palpitations, diaphoresis, change in appetite, change in weigh or increased thirst ?Hematologic/Lymphatic:  No anemia, purpura, petechiae. ?Allergic/Immunologic: No itchy/runny eyes, nasal congestion, recent allergic reactions, rashes ? ?ALLERGIES: ?No Known Allergies ? ?HOME MEDICATIONS: ? ?Current Outpatient Medications:  ?  aspirin-acetaminophen-caffeine (EXCEDRIN MIGRAINE) 250-250-65 MG tablet, Take 1-2 tablets by mouth every 6 (six) hours as needed for headache., Disp: , Rfl:  ?  cholecalciferol (VITAMIN D) 1000 units tablet, Take 1,000 Units by mouth daily., Disp: , Rfl:  ?  levonorgestrel (  MIRENA, 52 MG,) 20 MCG/DAY IUD, IUD, Disp: , Rfl:  ?  ocrelizumab (OCREVUS) 300 MG/10ML injection, Inject into the vein every 6 (six) months., Disp: , Rfl:  ?  SUMAtriptan (IMITREX) 50 MG tablet, sumatriptan 50 mg tablet  TAKE 1 TABLET BY MOUTH ONCE DAILY AS NEEDED FOR MIGRAINE. DO NOT TAKE MORE THAN 3 DAYS PER WEEK. MAX 200MG  PER DAY., Disp: , Rfl:  ?  gabapentin (NEURONTIN) 300 MG capsule, Take up to 3 po daily, Disp: 90 capsule, Rfl: 11 ?  methylphenidate (RITALIN) 10 MG tablet, Take 1 tablet (10 mg total) by mouth as needed. For fatigue, Disp: 30 tablet, Rfl: 0 ? ?PAST MEDICAL HISTORY: ?Past Medical History:  ?Diagnosis Date  ? Multiple sclerosis (HCC)   ? Multiple sclerosis (HCC)   ? Neck pain   ? with numbness in hands  ? Optic neuritis   ? ? ?PAST SURGICAL HISTORY: ?Past Surgical History:  ?Procedure Laterality Date  ? OVARY SURGERY    ? ? ?FAMILY HISTORY: ?History reviewed. No pertinent family history. ? ?SOCIAL HISTORY: ? ?Social History  ? ?Socioeconomic History  ? Marital status: Married  ?  Spouse name: Not on file  ? Number of children: Not on file  ? Years  of education: Not on file  ? Highest education level: Not on file  ?Occupational History  ? Not on file  ?Tobacco Use  ? Smoking status: Every Day  ? Smokeless tobacco: Never  ?Substance and Sexual Activity  ?

## 2021-12-10 LAB — QUANTIFERON-TB GOLD PLUS
QuantiFERON Mitogen Value: >10 IU/mL
QuantiFERON Nil Value: 0.05 IU/mL
QuantiFERON TB1 Ag Value: 0.14 IU/mL
QuantiFERON TB2 Ag Value: 0.06 IU/mL
QuantiFERON-TB Gold Plus: NEGATIVE

## 2021-12-10 LAB — IGG, IGA, IGM
IgA/Immunoglobulin A, Serum: 154 mg/dL (ref 87–352)
IgG (Immunoglobin G), Serum: 936 mg/dL (ref 586–1602)
IgM (Immunoglobulin M), Srm: 48 mg/dL (ref 26–217)

## 2021-12-10 LAB — CBC WITH DIFFERENTIAL/PLATELET
Basophils Absolute: 0.1 10*3/uL (ref 0.0–0.2)
Basos: 1 %
EOS (ABSOLUTE): 0.3 10*3/uL (ref 0.0–0.4)
Eos: 4 %
Hematocrit: 39.9 % (ref 34.0–46.6)
Hemoglobin: 13 g/dL (ref 11.1–15.9)
Immature Grans (Abs): 0 10*3/uL (ref 0.0–0.1)
Immature Granulocytes: 0 %
Lymphocytes Absolute: 3 10*3/uL (ref 0.7–3.1)
Lymphs: 37 %
MCH: 28.7 pg (ref 26.6–33.0)
MCHC: 32.6 g/dL (ref 31.5–35.7)
MCV: 88 fL (ref 79–97)
Monocytes Absolute: 0.7 10*3/uL (ref 0.1–0.9)
Monocytes: 8 %
Neutrophils Absolute: 4.1 10*3/uL (ref 1.4–7.0)
Neutrophils: 50 %
Platelets: 384 10*3/uL (ref 150–450)
RBC: 4.53 x10E6/uL (ref 3.77–5.28)
RDW: 13 % (ref 11.7–15.4)
WBC: 8.2 10*3/uL (ref 3.4–10.8)

## 2021-12-10 LAB — HEPATITIS B SURFACE ANTIGEN: Hepatitis B Surface Ag: NEGATIVE

## 2021-12-10 LAB — HEPATITIS B SURFACE ANTIBODY,QUALITATIVE: Hep B Surface Ab, Qual: NONREACTIVE

## 2021-12-10 NOTE — Telephone Encounter (Signed)
Faxed completed/signed order to [employer] at [phone]. Received fax confirmation. ?

## 2021-12-11 NOTE — Telephone Encounter (Signed)
Received patient's insurance card. Ocrevus start form faxed to [facility]. Received a receipt of confirmation. ? ?Ocrevus order given to [facility] for processing. ?

## 2021-12-13 ENCOUNTER — Telehealth: Payer: Self-pay | Admitting: Neurology

## 2021-12-13 NOTE — Telephone Encounter (Signed)
[employer] pending faxed notes  °

## 2021-12-18 ENCOUNTER — Other Ambulatory Visit: Payer: Self-pay | Admitting: Neurology

## 2021-12-18 MED ORDER — ALPRAZOLAM 0.5 MG PO TABS: ORAL_TABLET | ORAL | 0 refills | Status: DC

## 2021-12-18 NOTE — Telephone Encounter (Signed)
MR Brain w/wo contrast & MR Cervical spine w/wo contrast Dr. Epimenio [facility] [employer] auth: [id number] (exp. 12/13/21 to 03/15/22). ? ?Patient is scheduled at [facility] for 12/25/21. ? ?Patient also informed me she is claustrophobic and would like something to help her, she is aware to have a driver.  ?

## 2021-12-20 ENCOUNTER — Telehealth: Payer: Self-pay | Admitting: Neurology

## 2021-12-20 NOTE — Telephone Encounter (Signed)
Pt need to get an infusion as soon as possible. Needed to cancel MRI appt ?

## 2021-12-24 NOTE — Telephone Encounter (Signed)
Patient r/s for 01/02/22.  ?

## 2021-12-24 NOTE — Telephone Encounter (Signed)
I spoke with the patient she is r/s for 01/01/22. ?

## 2021-12-25 ENCOUNTER — Other Ambulatory Visit: Payer: 59

## 2021-12-31 NOTE — Telephone Encounter (Signed)
Patient's Ocrevus infusion was 12/25/2021. ?

## 2022-01-01 ENCOUNTER — Other Ambulatory Visit: Payer: 59

## 2022-01-02 ENCOUNTER — Ambulatory Visit: Payer: 59

## 2022-01-02 DIAGNOSIS — G35 Multiple sclerosis: Secondary | ICD-10-CM

## 2022-01-02 MED ORDER — GADOBENATE DIMEGLUMINE 529 MG/ML IV SOLN
16.0000 mL | Freq: Once | INTRAVENOUS | Status: AC | PRN
  Administered 2022-01-02: 16 mL via INTRAVENOUS

## 2022-01-23 ENCOUNTER — Ambulatory Visit (INDEPENDENT_AMBULATORY_CARE_PROVIDER_SITE_OTHER): Payer: 59 | Admitting: Nurse Practitioner

## 2022-01-23 ENCOUNTER — Encounter: Payer: Self-pay | Admitting: Nurse Practitioner

## 2022-01-23 VITALS — BP 128/68 | HR 75 | Temp 97.4°F | Ht 64.0 in | Wt 174.0 lb

## 2022-01-23 DIAGNOSIS — G43709 Chronic migraine without aura, not intractable, without status migrainosus: Secondary | ICD-10-CM

## 2022-01-23 DIAGNOSIS — E663 Overweight: Secondary | ICD-10-CM

## 2022-01-23 DIAGNOSIS — Q6691 Congenital deformity of feet, unspecified, right foot: Secondary | ICD-10-CM

## 2022-01-23 DIAGNOSIS — G35 Multiple sclerosis: Secondary | ICD-10-CM | POA: Diagnosis not present

## 2022-01-23 DIAGNOSIS — L308 Other specified dermatitis: Secondary | ICD-10-CM | POA: Diagnosis not present

## 2022-01-23 MED ORDER — PHENTERMINE HCL 37.5 MG PO CAPS: 37.5000 mg | ORAL_CAPSULE | ORAL | 0 refills | Status: DC

## 2022-01-23 MED ORDER — TRIAMCINOLONE ACETONIDE 0.1 % EX CREA: 1.0000 "application " | TOPICAL_CREAM | Freq: Two times a day (BID) | CUTANEOUS | 2 refills | Status: DC

## 2022-01-23 NOTE — Patient Instructions (Signed)
Begin Phentermine 37.5 mg, take 1/2 tablet for first few weeks ?Follow-up in 4-weeks ?Heart healthy diet ?Increase physical activity ? ? ?Exercising to Lose Weight ?Getting regular exercise is important for everyone. It is especially important if you are overweight. Being overweight increases your risk of heart disease, stroke, diabetes, high blood pressure, and several types of cancer. Exercising, and reducing the calories you consume, can help you lose weight and improve fitness and health. ?Exercise can be moderate or vigorous intensity. To lose weight, most people need to do a certain amount of moderate or vigorous-intensity exercise each week. ?How can exercise affect me? ?You lose weight when you exercise enough to burn more calories than you eat. Exercise also reduces body fat and builds muscle. The more muscle you have, the more calories you burn. Exercise also: ?Improves mood. ?Reduces stress and tension. ?Improves your overall fitness, flexibility, and endurance. ?Increases bone strength. ?Moderate-intensity exercise ? ?Moderate-intensity exercise is any activity that gets you moving enough to burn at least three times more energy (calories) than if you were sitting. ?Examples of moderate exercise include: ?Walking a mile in 15 minutes. ?Doing light yard work. ?Biking at an easy pace. ?Most people should get at least 150 minutes of moderate-intensity exercise a week to maintain their body weight. ?Vigorous-intensity exercise ?Vigorous-intensity exercise is any activity that gets you moving enough to burn at least six times more calories than if you were sitting. When you exercise at this intensity, you should be working hard enough that you are not able to carry on a conversation. ?Examples of vigorous exercise include: ?Running. ?Playing a team sport, such as football, basketball, and soccer. ?Jumping rope. ?Most people should get at least 75 minutes a week of vigorous exercise to maintain their body  weight. ?What actions can I take to lose weight? ?The amount of exercise you need to lose weight depends on: ?Your age. ?The type of exercise. ?Any health conditions you have. ?Your overall physical ability. ?Talk to your health care provider about how much exercise you need and what types of activities are safe for you. ?Nutrition ? ?Make changes to your diet as told by your health care provider or diet and nutrition specialist (dietitian). This may include: ?Eating fewer calories. ?Eating more protein. ?Eating less unhealthy fats. ?Eating a diet that includes fresh fruits and vegetables, whole grains, low-fat dairy products, and lean protein. ?Avoiding foods with added fat, salt, and sugar. ?Drink plenty of water while you exercise to prevent dehydration or heat stroke. ?Activity ?Choose an activity that you enjoy and set realistic goals. Your health care provider can help you make an exercise plan that works for you. ?Exercise at a moderate or vigorous intensity most days of the week. ?The intensity of exercise may vary from person to person. You can tell how intense a workout is for you by paying attention to your breathing and heartbeat. Most people will notice their breathing and heartbeat get faster with more intense exercise. ?Do resistance training twice each week, such as: ?Push-ups. ?Sit-ups. ?Lifting weights. ?Using resistance bands. ?Getting short amounts of exercise can be just as helpful as long, structured periods of exercise. If you have trouble finding time to exercise, try doing these things as part of your daily routine: ?Get up, stretch, and walk around every 30 minutes throughout the day. ?Go for a walk during your lunch break. ?Park your car farther away from your destination. ?If you take public transportation, get off one stop early and walk  the rest of the way. ?Make phone calls while standing up and walking around. ?Take the stairs instead of elevators or escalators. ?Wear comfortable  clothes and shoes with good support. ?Do not exercise so much that you hurt yourself, feel dizzy, or get very short of breath. ?Where to find more information ?[place] Department of Health and Human Services: BondedCompany.at ?Centers for Disease Control and Prevention: http://www.wolf.info/ ?Contact a health care provider: ?Before starting a new exercise program. ?If you have questions or concerns about your weight. ?If you have a medical problem that keeps you from exercising. ?Get help right away if: ?You have any of the following while exercising: ?Injury. ?Dizziness. ?Difficulty breathing or shortness of breath that does not go away when you stop exercising. ?Chest pain. ?Rapid heartbeat. ?These symptoms may represent a serious problem that is an emergency. Do not wait to see if the symptoms will go away. Get medical help right away. Call your local emergency services (911 in the [place] Do not drive yourself to the hospital. ?Summary ?Getting regular exercise is especially important if you are overweight. ?Being overweight increases your risk of heart disease, stroke, diabetes, high blood pressure, and several types of cancer. ?Losing weight happens when you burn more calories than you eat. ?Reducing the amount of calories you eat, and getting regular moderate or vigorous exercise each week, helps you lose weight. ?This information is not intended to replace advice given to you by your health care provider. Make sure you discuss any questions you have with your health care provider. ?Document Revised: 11/05/2020 Document Reviewed: 11/05/2020 ?Elsevier Patient Education ? [place] for Weight Loss ?Calories are units of energy. Your body needs a certain number of calories from food to keep going throughout the day. When you eat or drink more calories than your body needs, your body stores the extra calories mostly as fat. When you eat or drink fewer calories than your body needs, your body burns fat to  get the energy it needs. ?Calorie counting means keeping track of how many calories you eat and drink each day. Calorie counting can be helpful if you need to lose weight. If you eat fewer calories than your body needs, you should lose weight. Ask your health care provider what a healthy weight is for you. ?For calorie counting to work, you will need to eat the right number of calories each day to lose a healthy amount of weight per week. A dietitian can help you figure out how many calories you need in a day and will suggest ways to reach your calorie goal. ?A healthy amount of weight to lose each week is usually 1-2 lb (0.5-0.9 kg). This usually means that your daily calorie intake should be reduced by 500-750 calories. ?Eating 1,200-1,500 calories a day can help most women lose weight. ?Eating 1,500-1,800 calories a day can help most men lose weight. ?What do I need to know about calorie counting? ?Work with your health care provider or dietitian to determine how many calories you should get each day. To meet your daily calorie goal, you will need to: ?Find out how many calories are in each food that you would like to eat. Try to do this before you eat. ?Decide how much of the food you plan to eat. ?Keep a food log. Do this by writing down what you ate and how many calories it had. ?To successfully lose weight, it is important to balance calorie counting with a healthy lifestyle  that includes regular activity. ?Where do I find calorie information? ? ?The number of calories in a food can be found on a Nutrition Facts label. If a food does not have a Nutrition Facts label, try to look up the calories online or ask your dietitian for help. ?Remember that calories are listed per serving. If you choose to have more than one serving of a food, you will have to multiply the calories per serving by the number of servings you plan to eat. For example, the label on a package of bread might say that a serving size is 1  slice and that there are 90 calories in a serving. If you eat 1 slice, you will have eaten 90 calories. If you eat 2 slices, you will have eaten 180 calories. ?How do I keep a food log? ?After each time that you

## 2022-01-23 NOTE — Progress Notes (Signed)
? ?New Patient Office Visit ? ?Subjective   ? ?Patient ID: Lisa Wu, female    DOB: May 12, 1981  Age: 41 y.o. MRN: [id number] ? ?CC:  ?Chief Complaint  ?Patient presents with  ? Establish Care  ? ? ?HPI ?Lisa Wu presents to establish care. This is her initial visit to the office. Transferred from previous provider due to change in health insurance. She has labs drawn recently by previous PCP. States she would like to discuss weight management and eczema treatment. She was diagnosed with MS at age 11. She is followed by [facility]. Current treatment includes Ocrevus. States symptoms are currently well controlled. Reports she had severe COVID-19 pneumonia Sept 2021, spent one week hospitalized. States she has chronic dyspnea with exertion since that time. Denies following up with pulmonary specialist. She tells me she has often been treated with Solu-Medrol during MS flares. Attributes 50 pound weight gain over the past two years to COVID-19 and IV steroid treatment.  ? ?Outpatient Encounter Medications as of 01/23/2022  ?Medication Sig  ? aspirin-acetaminophen-caffeine (EXCEDRIN MIGRAINE) 250-250-65 MG tablet Take 1-2 tablets by mouth every 6 (six) hours as needed for headache.  ? cholecalciferol (VITAMIN D) 1000 units tablet Take 1,000 Units by mouth daily.  ? gabapentin (NEURONTIN) 300 MG capsule Take up to 3 po daily  ? levonorgestrel (MIRENA, 52 MG,) 20 MCG/DAY IUD IUD  ? methylphenidate (RITALIN) 10 MG tablet Take 1 tablet (10 mg total) by mouth as needed. For fatigue  ? ocrelizumab (OCREVUS) 300 MG/10ML injection Inject into the vein every 6 (six) months.  ? SUMAtriptan (IMITREX) 50 MG tablet sumatriptan 50 mg tablet ? TAKE 1 TABLET BY MOUTH ONCE DAILY AS NEEDED FOR MIGRAINE. DO NOT TAKE MORE THAN 3 DAYS PER WEEK. MAX 200MG  PER DAY.  ? ALPRAZolam (XANAX) 0.5 MG tablet Take 2-3 p.o. before MRI  ? ?No facility-administered encounter medications on file as of 01/23/2022.  ? ? ?Past  Medical History:  ?Diagnosis Date  ? Migraines   ? Multiple sclerosis (HCC)   ? Neck pain   ? with numbness in hands  ? Optic neuritis   ? Personal history of COVID-19   ? ? ?Past Surgical History:  ?Procedure Laterality Date  ? OVARY SURGERY    ? polyp excision  ? ? ?Family History  ?Problem Relation Age of Onset  ? Diabetes Mother   ? Asthma Mother   ? Depression Mother   ? Alcoholism Father   ? Drug abuse Brother   ?     Pain meds  ? Alcoholism Brother   ? Cirrhosis Brother   ? ? ?Social History  ? ?Socioeconomic History  ? Marital status: Married  ?  Spouse name: Tishana Era  ? Number of children: 2  ? Years of education: Not on file  ? Highest education level: Not on file  ?Occupational History  ? Occupation: Homemaker  ?Tobacco Use  ? Smoking status: Former  ?  Packs/day: 0.50  ?  Types: Cigarettes  ? Smokeless tobacco: Never  ?Vaping Use  ? Vaping Use: Former  ?Substance and Sexual Activity  ? Alcohol use: No  ? Drug use: No  ? Sexual activity: Yes  ?  Partners: Male  ?Other Topics Concern  ? Not on file  ?Social History Narrative  ? Pt is married with 2 children.  Caffeine 2 servings daily.   ? ?Social Determinants of Health  ? ?Financial Resource Strain: Low Risk   ? Difficulty of  Paying Living Expenses: Not hard at all  ?Food Insecurity: No Food Insecurity  ? Worried About Programme researcher, broadcasting/film/video in the Last Year: Never true  ? Ran Out of Food in the Last Year: Never true  ?Transportation Needs: No Transportation Needs  ? Lack of Transportation (Medical): No  ? Lack of Transportation (Non-Medical): No  ?Physical Activity: Insufficiently Active  ? Days of Exercise per Week: 3 days  ? Minutes of Exercise per Session: 20 min  ?Stress: No Stress Concern Present  ? Feeling of Stress : Not at all  ?Social Connections: Moderately Integrated  ? Frequency of Communication with Friends and Family: More than three times a week  ? Frequency of Social Gatherings with Friends and Family: More than three times a week  ?  Attends Religious Services: More than 4 times per year  ? Active Member of Clubs or Organizations: No  ? Attends Banker Meetings: Never  ? Marital Status: Married  ?Intimate Partner Violence: Not At Risk  ? Fear of Current or Ex-Partner: No  ? Emotionally Abused: No  ? Physically Abused: No  ? Sexually Abused: No  ? ? ?Review of Systems  ?Constitutional:  Positive for malaise/fatigue. Negative for chills and fever.  ?HENT:  Negative for ear pain, sinus pain and sore throat.   ?Respiratory:  Negative for cough and shortness of breath.   ?Cardiovascular:  Negative for chest pain.  ?Musculoskeletal:  Positive for joint pain (right ankle). Negative for myalgias.  ?Skin:  Positive for rash.  ?Neurological:  Positive for headaches (chronic migraines).  ?All other systems reviewed and are negative. ? ?  ? ? ?Objective   ? ?BP 128/68   Pulse 75   Temp (!) 97.4 ?F (36.3 ?C)   Ht 5\' 4"  (1.626 m)   Wt 174 lb (78.9 kg)   LMP  (LMP Unknown)   SpO2 99%   BMI 29.87 kg/m?   ? ?Physical Exam ?Vitals reviewed.  ?Constitutional:   ?   Appearance: Normal appearance.  ?HENT:  ?   Head: Normocephalic.  ?   Right Ear: Tympanic membrane normal.  ?   Left Ear: Tympanic membrane normal.  ?   Nose: Nose normal.  ?   Mouth/Throat:  ?   Mouth: Mucous membranes are moist.  ?Eyes:  ?   Pupils: Pupils are equal, round, and reactive to light.  ?Cardiovascular:  ?   Rate and Rhythm: Normal rate and regular rhythm.  ?   Pulses: Normal pulses.  ?   Heart sounds: Normal heart sounds.  ?Pulmonary:  ?   Effort: Pulmonary effort is normal.  ?   Breath sounds: Normal breath sounds.  ?Abdominal:  ?   General: Bowel sounds are normal.  ?   Palpations: Abdomen is soft.  ?Musculoskeletal:     ?   General: Normal range of motion.  ?   Cervical back: Neck supple.  ?Skin: ?   General: Skin is warm and dry.  ?   Capillary Refill: Capillary refill takes less than 2 seconds.  ?   Findings: Rash present.  ? ?    ?   Comments: Oval shaped eczema  dry scaly patches to anterior neck approximately 3 cm in diameter  ?Neurological:  ?   General: No focal deficit present.  ?   Mental Status: She is alert and oriented to person, place, and time.  ?Psychiatric:     ?   Mood and Affect: Mood normal.     ?  Behavior: Behavior normal.  ? ? ?  ? ?Assessment & Plan:  ? ? ?1. Multiple sclerosis (HCC)-well controlled ?-continue follow-up with [facility]  ?-continue Ocrevus as scheduled ? ?2. Other eczema ?- triamcinolone cream (KENALOG) 0.1 %; Apply 1 application. topically 2 (two) times daily.  Dispense: 30 g; Refill: 2 ? ?3. Congenital deformity of right foot ? ?4. Chronic migraine without aura without status migrainosus, not intractable ?-continue Imitrex as prescribed ? ?5. Overweight (BMI 25.0-29.9) ?- phentermine 37.5 MG capsule; Take 1 capsule (37.5 mg total) by mouth every morning.  Dispense: 30 capsule; Refill: 0 ?  ?  ?Begin Phentermine 37.5 mg, take 1/2 tablet for first few weeks ?Follow-up in 4-weeks ?Heart healthy diet ?Increase physical activity ? ?Follow-up: 4-weeks ? ?I, Janie Morning, NP, have reviewed all documentation for this visit. The documentation on 01/23/22 for the exam, diagnosis, procedures, and orders are all accurate and complete. ? ?Signed, ?Flonnie Hailstone, DNP  ? ?

## 2022-02-20 ENCOUNTER — Encounter: Payer: Self-pay | Admitting: Nurse Practitioner

## 2022-02-20 ENCOUNTER — Ambulatory Visit (INDEPENDENT_AMBULATORY_CARE_PROVIDER_SITE_OTHER): Payer: 59 | Admitting: Nurse Practitioner

## 2022-02-20 VITALS — BP 112/80 | HR 77 | Temp 97.4°F | Ht 64.0 in | Wt 164.4 lb

## 2022-02-20 DIAGNOSIS — L308 Other specified dermatitis: Secondary | ICD-10-CM | POA: Diagnosis not present

## 2022-02-20 DIAGNOSIS — E663 Overweight: Secondary | ICD-10-CM

## 2022-02-20 DIAGNOSIS — G35 Multiple sclerosis: Secondary | ICD-10-CM

## 2022-02-20 MED ORDER — PHENTERMINE HCL 37.5 MG PO TABS: 37.5000 mg | ORAL_TABLET | Freq: Every day | ORAL | 0 refills | Status: DC

## 2022-02-20 MED ORDER — DESONIDE 0.05 % EX CREA: TOPICAL_CREAM | Freq: Two times a day (BID) | CUTANEOUS | 0 refills | Status: DC

## 2022-02-20 NOTE — Progress Notes (Signed)
Subjective:  Patient ID: Lisa Wu, female    DOB: June 02, 1981  Age: 41 y.o. MRN: [id number]  Chief Complaint  Patient presents with   Weight Management Follow Up    HPI   Patient presents today for 4 week follow up for weight management and eczema rash. Currently taking Phentermine 37.5 mg daily has lost 11 pounds since her last visit. Has increased her water intake and eats approx 3 prunes a day to prevent constipation. Denies side effects of medications. Pt applying triamcinolone cream to eczema rash to anterior neck, extremities, and face PRN. States rash has improved.   Current Outpatient Medications on File Prior to Visit  Medication Sig Dispense Refill   ALPRAZolam (XANAX) 0.5 MG tablet Take 2-3 p.o. before MRI 3 tablet 0   aspirin-acetaminophen-caffeine (EXCEDRIN MIGRAINE) 250-250-65 MG tablet Take 1-2 tablets by mouth every 6 (six) hours as needed for headache.     cholecalciferol (VITAMIN D) 1000 units tablet Take 1,000 Units by mouth daily.     gabapentin (NEURONTIN) 300 MG capsule Take up to 3 po daily 90 capsule 11   levonorgestrel (MIRENA, 52 MG,) 20 MCG/DAY IUD IUD     methylphenidate (RITALIN) 10 MG tablet Take 1 tablet (10 mg total) by mouth as needed. For fatigue 30 tablet 0   ocrelizumab (OCREVUS) 300 MG/10ML injection Inject into the vein every 6 (six) months.     phentermine 37.5 MG capsule Take 1 capsule (37.5 mg total) by mouth every morning. 30 capsule 0   SUMAtriptan (IMITREX) 50 MG tablet sumatriptan 50 mg tablet  TAKE 1 TABLET BY MOUTH ONCE DAILY AS NEEDED FOR MIGRAINE. DO NOT TAKE MORE THAN 3 DAYS PER WEEK. MAX 200MG  PER DAY.     triamcinolone cream (KENALOG) 0.1 % Apply 1 application. topically 2 (two) times daily. 30 g 2   No current facility-administered medications on file prior to visit.   Past Medical History:  Diagnosis Date   Migraines    Multiple sclerosis (HCC)    Neck pain    with numbness in hands   Optic neuritis    Personal  history of COVID-19    Past Surgical History:  Procedure Laterality Date   OVARY SURGERY     polyp excision    Family History  Problem Relation Age of Onset   Diabetes Mother    Asthma Mother    Depression Mother    Alcoholism Father    Drug abuse Brother        Pain meds   Alcoholism Brother    Cirrhosis Brother    Social History   Socioeconomic History   Marital status: Married    Spouse name: Destynie Toomey   Number of children: 2   Years of education: Not on file   Highest education level: Not on file  Occupational History   Occupation: Homemaker  Tobacco Use   Smoking status: Former    Packs/day: 0.50    Types: Cigarettes   Smokeless tobacco: Never  Vaping Use   Vaping Use: Former  Substance and Sexual Activity   Alcohol use: No   Drug use: No   Sexual activity: Yes    Partners: Male  Other Topics Concern   Not on file  Social History Narrative   Pt is married with 2 children.  Caffeine 2 servings daily.    Social Determinants of Health   Financial Resource Strain: Low Risk    Difficulty of Paying Living Expenses: Not hard at all  Food Insecurity: No Food Insecurity   Worried About Programme researcher, broadcasting/film/video in the Last Year: Never true   Ran Out of Food in the Last Year: Never true  Transportation Needs: No Transportation Needs   Lack of Transportation (Medical): No   Lack of Transportation (Non-Medical): No  Physical Activity: Insufficiently Active   Days of Exercise per Week: 3 days   Minutes of Exercise per Session: 20 min  Stress: No Stress Concern Present   Feeling of Stress : Not at all  Social Connections: Moderately Integrated   Frequency of Communication with Friends and Family: More than three times a week   Frequency of Social Gatherings with Friends and Family: More than three times a week   Attends Religious Services: More than 4 times per year   Active Member of [employer] or Organizations: No   Attends Banker Meetings: Never    Marital Status: Married    Review of Systems  Constitutional:  Negative for chills and fever.  Respiratory:  Negative for shortness of breath.   Cardiovascular:  Negative for chest pain.  Gastrointestinal:  Negative for abdominal pain, constipation and diarrhea.  Skin:  Positive for rash.    Objective:  BP 112/80   Pulse 77   Temp (!) 97.4 F (36.3 C)   Ht 5\' 4"  (1.626 m)   Wt 164 lb 6.4 oz (74.6 kg)   LMP  (LMP Unknown)   SpO2 97%   BMI 28.22 kg/m       02/20/2022   10:59 AM 01/23/2022    9:58 AM 12/06/2021    8:50 AM  BP/Weight  Systolic BP  128 12/08/2021  Diastolic BP  68 76  Wt. (Lbs) 164.4 174 175.5  BMI 28.22 kg/m2 29.87 kg/m2 30.12 kg/m2    Physical Exam Vitals reviewed.  Skin:    General: Skin is warm and dry.     Capillary Refill: Capillary refill takes less than 2 seconds.     Findings: Rash present.     Comments: Eczema rash to bilateral eyelids and anterior neck  Neurological:     General: No focal deficit present.     Mental Status: She is alert and oriented to person, place, and time.    Lab Results  Component Value Date   WBC 8.2 12/06/2021   HGB 13.0 12/06/2021   HCT 39.9 12/06/2021   PLT 384 12/06/2021   GLUCOSE 111 (H) 12/07/2015   NA 140 12/07/2015   K 3.7 12/07/2015   CL 101 12/07/2015   CREATININE 0.64 12/07/2015   BUN 13 12/07/2015   CO2 26 12/07/2015   HGBA1C 5.4 12/08/2015      Assessment & Plan:   1. Other eczema - desonide (DESOWEN) 0.05 % cream; Apply topically 2 (two) times daily.  Dispense: 30 g; Refill: 0  2. Overweight (BMI 25.0-29.9) - phentermine (ADIPEX-P) 37.5 MG tablet; Take 1 tablet (37.5 mg total) by mouth daily before breakfast.  Dispense: 30 tablet; Refill: 0   Continue medications Follow-up in 27-months    Follow-up: 46-months  An After Visit Summary was printed and given to the patient.  I, 2-month, NP, have reviewed all documentation for this visit. The documentation on 02/24/22 for the exam,  diagnosis, procedures, and orders are all accurate and complete.    Signed, 04/26/22, NP [facility] [phone]

## 2022-02-20 NOTE — Patient Instructions (Signed)
Continue medications Follow-up in 19-months   Calorie Counting for Weight Loss Calories are units of energy. Your body needs a certain number of calories from food to keep going throughout the day. When you eat or drink more calories than your body needs, your body stores the extra calories mostly as fat. When you eat or drink fewer calories than your body needs, your body burns fat to get the energy it needs. Calorie counting means keeping track of how many calories you eat and drink each day. Calorie counting can be helpful if you need to lose weight. If you eat fewer calories than your body needs, you should lose weight. Ask your health care provider what a healthy weight is for you. For calorie counting to work, you will need to eat the right number of calories each day to lose a healthy amount of weight per week. A dietitian can help you figure out how many calories you need in a day and will suggest ways to reach your calorie goal. A healthy amount of weight to lose each week is usually 1-2 lb (0.5-0.9 kg). This usually means that your daily calorie intake should be reduced by 500-750 calories. Eating 1,200-1,500 calories a day can help most women lose weight. Eating 1,500-1,800 calories a day can help most men lose weight. What do I need to know about calorie counting? Work with your health care provider or dietitian to determine how many calories you should get each day. To meet your daily calorie goal, you will need to: Find out how many calories are in each food that you would like to eat. Try to do this before you eat. Decide how much of the food you plan to eat. Keep a food log. Do this by writing down what you ate and how many calories it had. To successfully lose weight, it is important to balance calorie counting with a healthy lifestyle that includes regular activity. Where do I find calorie information?  The number of calories in a food can be found on a Nutrition Facts label. If a  food does not have a Nutrition Facts label, try to look up the calories online or ask your dietitian for help. Remember that calories are listed per serving. If you choose to have more than one serving of a food, you will have to multiply the calories per serving by the number of servings you plan to eat. For example, the label on a package of bread might say that a serving size is 1 slice and that there are 90 calories in a serving. If you eat 1 slice, you will have eaten 90 calories. If you eat 2 slices, you will have eaten 180 calories. How do I keep a food log? After each time that you eat, record the following in your food log as soon as possible: What you ate. Be sure to include toppings, sauces, and other extras on the food. How much you ate. This can be measured in cups, ounces, or number of items. How many calories were in each food and drink. The total number of calories in the food you ate. Keep your food log near you, such as in a pocket-sized notebook or on an app or website on your mobile phone. Some programs will calculate calories for you and show you how many calories you have left to meet your daily goal. What are some portion-control tips? Know how many calories are in a serving. This will help you know how many  servings you can have of a certain food. Use a measuring cup to measure serving sizes. You could also try weighing out portions on a kitchen scale. With time, you will be able to estimate serving sizes for some foods. Take time to put servings of different foods on your favorite plates or in your favorite bowls and cups so you know what a serving looks like. Try not to eat straight from a food's packaging, such as from a bag or box. Eating straight from the package makes it hard to see how much you are eating and can lead to overeating. Put the amount you would like to eat in a cup or on a plate to make sure you are eating the right portion. Use smaller plates, glasses, and  bowls for smaller portions and to prevent overeating. Try not to multitask. For example, avoid watching TV or using your computer while eating. If it is time to eat, sit down at a table and enjoy your food. This will help you recognize when you are full. It will also help you be more mindful of what and how much you are eating. What are tips for following this plan? Reading food labels Check the calorie count compared with the serving size. The serving size may be smaller than what you are used to eating. Check the source of the calories. Try to choose foods that are high in protein, fiber, and vitamins, and low in saturated fat, trans fat, and sodium. Shopping Read nutrition labels while you shop. This will help you make healthy decisions about which foods to buy. Pay attention to nutrition labels for low-fat or fat-free foods. These foods sometimes have the same number of calories or more calories than the full-fat versions. They also often have added sugar, starch, or salt to make up for flavor that was removed with the fat. Make a grocery list of lower-calorie foods and stick to it. Cooking Try to cook your favorite foods in a healthier way. For example, try baking instead of frying. Use low-fat dairy products. Meal planning Use more fruits and vegetables. One-half of your plate should be fruits and vegetables. Include lean proteins, such as chicken, [place], and fish. Lifestyle Each week, aim to do one of the following: 150 minutes of moderate exercise, such as walking. 75 minutes of vigorous exercise, such as running. General information Know how many calories are in the foods you eat most often. This will help you calculate calorie counts faster. Find a way of tracking calories that works for you. Get creative. Try different apps or programs if writing down calories does not work for you. What foods should I eat?  Eat nutritious foods. It is better to have a nutritious, high-calorie  food, such as an avocado, than a food with few nutrients, such as a bag of potato chips. Use your calories on foods and drinks that will fill you up and will not leave you hungry soon after eating. Examples of foods that fill you up are nuts and nut butters, vegetables, lean proteins, and high-fiber foods such as whole grains. High-fiber foods are foods with more than 5 g of fiber per serving. Pay attention to calories in drinks. Low-calorie drinks include water and unsweetened drinks. The items listed above may not be a complete list of foods and beverages you can eat. Contact a dietitian for more information. What foods should I limit? Limit foods or drinks that are not good sources of vitamins, minerals, or protein or  that are high in unhealthy fats. These include: Candy. Other sweets. Sodas, specialty coffee drinks, alcohol, and juice. The items listed above may not be a complete list of foods and beverages you should avoid. Contact a dietitian for more information. How do I count calories when eating out? Pay attention to portions. Often, portions are much larger when eating out. Try these tips to keep portions smaller: Consider sharing a meal instead of getting your own. If you get your own meal, eat only half of it. Before you start eating, ask for a container and put half of your meal into it. When available, consider ordering smaller portions from the menu instead of full portions. Pay attention to your food and drink choices. Knowing the way food is cooked and what is included with the meal can help you eat fewer calories. If calories are listed on the menu, choose the lower-calorie options. Choose dishes that include vegetables, fruits, whole grains, low-fat dairy products, and lean proteins. Choose items that are boiled, broiled, grilled, or steamed. Avoid items that are buttered, battered, fried, or served with cream sauce. Items labeled as crispy are usually fried, unless stated  otherwise. Choose water, low-fat milk, unsweetened iced tea, or other drinks without added sugar. If you want an alcoholic beverage, choose a lower-calorie option, such as a glass of wine or light beer. Ask for dressings, sauces, and syrups on the side. These are usually high in calories, so you should limit the amount you eat. If you want a salad, choose a garden salad and ask for grilled meats. Avoid extra toppings such as bacon, cheese, or fried items. Ask for the dressing on the side, or ask for olive oil and vinegar or lemon to use as dressing. Estimate how many servings of a food you are given. Knowing serving sizes will help you be aware of how much food you are eating at restaurants. Where to find more information Centers for Disease Control and Prevention: FootballExhibition.com.br U.S. Department of Agriculture: WrestlingReporter.dk Summary Calorie counting means keeping track of how many calories you eat and drink each day. If you eat fewer calories than your body needs, you should lose weight. A healthy amount of weight to lose per week is usually 1-2 lb (0.5-0.9 kg). This usually means reducing your daily calorie intake by 500-750 calories. The number of calories in a food can be found on a Nutrition Facts label. If a food does not have a Nutrition Facts label, try to look up the calories online or ask your dietitian for help. Use smaller plates, glasses, and bowls for smaller portions and to prevent overeating. Use your calories on foods and drinks that will fill you up and not leave you hungry shortly after a meal. This information is not intended to replace advice given to you by your health care provider. Make sure you discuss any questions you have with your health care provider. Document Revised: 10/21/2019 Document Reviewed: 10/21/2019 Elsevier Patient Education  2023 Elsevier Inc.    Exercising to [employer] Getting regular exercise is important for everyone. It is especially important if you  are overweight. Being overweight increases your risk of heart disease, stroke, diabetes, high blood pressure, and several types of cancer. Exercising, and reducing the calories you consume, can help you lose weight and improve fitness and health. Exercise can be moderate or vigorous intensity. To lose weight, most people need to do a certain amount of moderate or vigorous-intensity exercise each week. How can exercise affect  me? You lose weight when you exercise enough to burn more calories than you eat. Exercise also reduces body fat and builds muscle. The more muscle you have, the more calories you burn. Exercise also: Improves mood. Reduces stress and tension. Improves your overall fitness, flexibility, and endurance. Increases bone strength. Moderate-intensity exercise  Moderate-intensity exercise is any activity that gets you moving enough to burn at least three times more energy (calories) than if you were sitting. Examples of moderate exercise include: Walking a mile in 15 minutes. Doing light yard work. Biking at an easy pace. Most people should get at least 150 minutes of moderate-intensity exercise a week to maintain their body weight. Vigorous-intensity exercise Vigorous-intensity exercise is any activity that gets you moving enough to burn at least six times more calories than if you were sitting. When you exercise at this intensity, you should be working hard enough that you are not able to carry on a conversation. Examples of vigorous exercise include: Running. Playing a team sport, such as football, basketball, and soccer. Jumping rope. Most people should get at least 75 minutes a week of vigorous exercise to maintain their body weight. What actions can I take to lose weight? The amount of exercise you need to lose weight depends on: Your age. The type of exercise. Any health conditions you have. Your overall physical ability. Talk to your health care provider about how  much exercise you need and what types of activities are safe for you. Nutrition  Make changes to your diet as told by your health care provider or diet and nutrition specialist (dietitian). This may include: Eating fewer calories. Eating more protein. Eating less unhealthy fats. Eating a diet that includes fresh fruits and vegetables, whole grains, low-fat dairy products, and lean protein. Avoiding foods with added fat, salt, and sugar. Drink plenty of water while you exercise to prevent dehydration or heat stroke. Activity Choose an activity that you enjoy and set realistic goals. Your health care provider can help you make an exercise plan that works for you. Exercise at a moderate or vigorous intensity most days of the week. The intensity of exercise may vary from person to person. You can tell how intense a workout is for you by paying attention to your breathing and heartbeat. Most people will notice their breathing and heartbeat get faster with more intense exercise. Do resistance training twice each week, such as: Push-ups. Sit-ups. Lifting weights. Using resistance bands. Getting short amounts of exercise can be just as helpful as long, structured periods of exercise. If you have trouble finding time to exercise, try doing these things as part of your daily routine: Get up, stretch, and walk around every 30 minutes throughout the day. Go for a walk during your lunch break. Park your car farther away from your destination. If you take public transportation, get off one stop early and walk the rest of the way. Make phone calls while standing up and walking around. Take the stairs instead of elevators or escalators. Wear comfortable clothes and shoes with good support. Do not exercise so much that you hurt yourself, feel dizzy, or get very short of breath. Where to find more information U.S. Department of Health and Human Services: ThisPath.fi Centers for Disease Control and  Prevention: FootballExhibition.com.br Contact a health care provider: Before starting a new exercise program. If you have questions or concerns about your weight. If you have a medical problem that keeps you from exercising. Get help right away if: You  have any of the following while exercising: Injury. Dizziness. Difficulty breathing or shortness of breath that does not go away when you stop exercising. Chest pain. Rapid heartbeat. These symptoms may represent a serious problem that is an emergency. Do not wait to see if the symptoms will go away. Get medical help right away. Call your local emergency services (911 in the [place] Do not drive yourself to the hospital. Summary Getting regular exercise is especially important if you are overweight. Being overweight increases your risk of heart disease, stroke, diabetes, high blood pressure, and several types of cancer. Losing weight happens when you burn more calories than you eat. Reducing the amount of calories you eat, and getting regular moderate or vigorous exercise each week, helps you lose weight. This information is not intended to replace advice given to you by your health care provider. Make sure you discuss any questions you have with your health care provider. Document Revised: 11/05/2020 Document Reviewed: 11/05/2020 Elsevier Patient Education  2023 [employer].

## 2022-02-21 ENCOUNTER — Telehealth: Payer: Self-pay

## 2022-02-21 NOTE — Telephone Encounter (Signed)
Phentermine not covered by insurance plan according to covermymeds.

## 2022-03-12 ENCOUNTER — Encounter: Payer: Self-pay | Admitting: Nurse Practitioner

## 2022-03-25 ENCOUNTER — Other Ambulatory Visit: Payer: Self-pay | Admitting: Nurse Practitioner

## 2022-03-25 DIAGNOSIS — E663 Overweight: Secondary | ICD-10-CM

## 2022-03-25 MED ORDER — PHENTERMINE HCL 37.5 MG PO CAPS: 37.5000 mg | ORAL_CAPSULE | ORAL | 0 refills | Status: DC

## 2022-04-23 ENCOUNTER — Ambulatory Visit (INDEPENDENT_AMBULATORY_CARE_PROVIDER_SITE_OTHER): Payer: Managed Care, Other (non HMO) | Admitting: Physician Assistant

## 2022-04-23 ENCOUNTER — Encounter: Payer: Self-pay | Admitting: Physician Assistant

## 2022-04-23 VITALS — BP 104/66 | HR 73 | Temp 98.3°F | Resp 16 | Ht 64.0 in | Wt 148.6 lb

## 2022-04-23 DIAGNOSIS — N3000 Acute cystitis without hematuria: Secondary | ICD-10-CM | POA: Diagnosis not present

## 2022-04-23 DIAGNOSIS — N3001 Acute cystitis with hematuria: Secondary | ICD-10-CM | POA: Insufficient documentation

## 2022-04-23 LAB — POCT URINALYSIS DIP (CLINITEK)
Bilirubin, UA: NEGATIVE
Blood, UA: NEGATIVE
Glucose, UA: NEGATIVE mg/dL
Ketones, POC UA: NEGATIVE mg/dL
Nitrite, UA: NEGATIVE
Spec Grav, UA: 1.015 (ref 1.010–1.025)
Urobilinogen, UA: 1 E.U./dL
pH, UA: 6 (ref 5.0–8.0)

## 2022-04-23 MED ORDER — NITROFURANTOIN MONOHYD MACRO 100 MG PO CAPS: 100.0000 mg | ORAL_CAPSULE | Freq: Two times a day (BID) | ORAL | 0 refills | Status: DC

## 2022-04-23 NOTE — Progress Notes (Signed)
Acute Office Visit  Subjective:    Patient ID: Lisa Wu, female    DOB: May 31, 1981, 41 y.o.   MRN: [id number]  Chief Complaint  Patient presents with   Dysuria    HPI: Patient is in today for complaints of urinary urgency and dysuria for the past few days - she denies abdominal pain - no nausea or vomiting Denies back pain No vaginal symptoms  Past Medical History:  Diagnosis Date   Migraines    Multiple sclerosis (HCC)    Neck pain    with numbness in hands   Optic neuritis    Personal history of COVID-19     Past Surgical History:  Procedure Laterality Date   OVARY SURGERY     polyp excision    Family History  Problem Relation Age of Onset   Diabetes Mother    Asthma Mother    Depression Mother    Alcoholism Father    Drug abuse Brother        Pain meds   Alcoholism Brother    Cirrhosis Brother     Social History   Socioeconomic History   Marital status: Married    Spouse name: Zeda Gangwer   Number of children: 2   Years of education: Not on file   Highest education level: Not on file  Occupational History   Occupation: Homemaker  Tobacco Use   Smoking status: Former    Packs/day: 0.50    Types: Cigarettes   Smokeless tobacco: Never  Vaping Use   Vaping Use: Former  Substance and Sexual Activity   Alcohol use: No   Drug use: No   Sexual activity: Yes    Partners: Male  Other Topics Concern   Not on file  Social History Narrative   Pt is married with 2 children.  Caffeine 2 servings daily.    Social Determinants of Health   Financial Resource Strain: Low Risk  (01/23/2022)   Overall Financial Resource Strain (CARDIA)    Difficulty of Paying Living Expenses: Not hard at all  Food Insecurity: No Food Insecurity (01/23/2022)   Hunger Vital Sign    Worried About Running Out of Food in the Last Year: Never true    Ran Out of Food in the Last Year: Never true  Transportation Needs: No Transportation Needs (01/23/2022)   PRAPARE -  Administrator, Civil Service (Medical): No    Lack of Transportation (Non-Medical): No  Physical Activity: Insufficiently Active (01/23/2022)   Exercise Vital Sign    Days of Exercise per Week: 3 days    Minutes of Exercise per Session: 20 min  Stress: No Stress Concern Present (01/23/2022)   [employer] of Occupational Health - Occupational Stress Questionnaire    Feeling of Stress : Not at all  Social Connections: Moderately Integrated (01/23/2022)   Social Connection and Isolation Panel [NHANES]    Frequency of Communication with Friends and Family: More than three times a week    Frequency of Social Gatherings with Friends and Family: More than three times a week    Attends Religious Services: More than 4 times per year    Active Member of [employer] or Organizations: No    Attends Banker Meetings: Never    Marital Status: Married  Catering manager Violence: Not At Risk (01/23/2022)   Humiliation, Afraid, Rape, and Kick questionnaire    Fear of Current or Ex-Partner: No    Emotionally Abused: No  Physically Abused: No    Sexually Abused: No    Outpatient Medications Prior to Visit  Medication Sig Dispense Refill   ALPRAZolam (XANAX) 0.5 MG tablet Take 2-3 p.o. before MRI 3 tablet 0   aspirin-acetaminophen-caffeine (EXCEDRIN MIGRAINE) 250-250-65 MG tablet Take 1-2 tablets by mouth every 6 (six) hours as needed for headache.     cholecalciferol (VITAMIN D) 1000 units tablet Take 1,000 Units by mouth daily.     desonide (DESOWEN) 0.05 % cream Apply topically 2 (two) times daily. 30 g 0   gabapentin (NEURONTIN) 300 MG capsule Take up to 3 po daily 90 capsule 11   levonorgestrel (MIRENA, 52 MG,) 20 MCG/DAY IUD IUD     ocrelizumab (OCREVUS) 300 MG/10ML injection Inject into the vein every 6 (six) months.     phentermine 37.5 MG capsule Take 1 capsule (37.5 mg total) by mouth every morning. 30 capsule 0   SUMAtriptan (IMITREX) 50 MG tablet sumatriptan 50 mg  tablet  TAKE 1 TABLET BY MOUTH ONCE DAILY AS NEEDED FOR MIGRAINE. DO NOT TAKE MORE THAN 3 DAYS PER WEEK. MAX 200MG  PER DAY.     triamcinolone cream (KENALOG) 0.1 % Apply 1 application. topically 2 (two) times daily. 30 g 2   methylphenidate (RITALIN) 10 MG tablet Take 1 tablet (10 mg total) by mouth as needed. For fatigue 30 tablet 0   phentermine (ADIPEX-P) 37.5 MG tablet Take 1 tablet (37.5 mg total) by mouth daily before breakfast. 30 tablet 0   No facility-administered medications prior to visit.    No Known Allergies  Review of Systems CONSTITUTIONAL: Negative for chills, fatigue, fever,  CARDIOVASCULAR: Negative for chest pain, dizziness, palpitations and pedal edema.  RESPIRATORY: Negative for recent cough and dyspnea.  GASTROINTESTINAL: Negative for abdominal pain, acid reflux symptoms, constipation, diarrhea, nausea and vomiting.  GU - see HPI        Objective:  PHYSICAL EXAM:   VS: BP 104/66   Pulse 73   Temp 98.3 F (36.8 C)   Resp 16   Ht 5\' 4"  (1.626 m)   Wt 148 lb 9.6 oz (67.4 kg)   SpO2 99%   BMI 25.51 kg/m   GEN: Well nourished, well developed, in no acute distress   Cardiac: RRR; no murmurs, Respiratory:  normal respiratory rate and pattern with no distress - normal breath sounds with no rales, rhonchi, wheezes or rubs GI: normal bowel sounds, no masses or tenderness  Psych: euthymic mood, appropriate affect and demeanor  Office Visit on 04/23/2022  Component Date Value Ref Range Status   Color, UA 04/23/2022 straw (A)  yellow Final   Clarity, UA 04/23/2022 cloudy (A)  clear Final   Glucose, UA 04/23/2022 negative  negative mg/dL Final   Bilirubin, UA 06/23/2022 negative  negative Final   Ketones, POC UA 04/23/2022 negative  negative mg/dL Final   Spec Grav, UA 08/67/6195 1.015  1.010 - 1.025 Final   Blood, UA 04/23/2022 negative  negative Final   pH, UA 04/23/2022 6.0  5.0 - 8.0 Final   POC PROTEIN,UA 04/23/2022 trace  negative, trace Final    Urobilinogen, UA 04/23/2022 1.0  0.2 or 1.0 E.U./dL Final   Nitrite, UA 06/23/2022 Negative  Negative Final   Leukocytes, UA 04/23/2022 Moderate (2+) (A)  Negative Final     Health Maintenance Due  Topic Date Due   TETANUS/TDAP  Never done   PAP SMEAR-Modifier  01/26/2019   INFLUENZA VACCINE  04/23/2022    There are no  preventive care reminders to display for this patient.   No results found for: "TSH" Lab Results  Component Value Date   WBC 8.2 12/06/2021   HGB 13.0 12/06/2021   HCT 39.9 12/06/2021   MCV 88 12/06/2021   PLT 384 12/06/2021   Lab Results  Component Value Date   NA 140 12/07/2015   K 3.7 12/07/2015   CO2 26 12/07/2015   GLUCOSE 111 (H) 12/07/2015   BUN 13 12/07/2015   CREATININE 0.64 12/07/2015   CALCIUM 9.5 12/07/2015   ANIONGAP 13 12/07/2015   No results found for: "CHOL" No results found for: "HDL" No results found for: "LDLCALC" No results found for: "TRIG" No results found for: "CHOLHDL" Lab Results  Component Value Date   HGBA1C 5.4 12/08/2015       Assessment & Plan:   Problem List Items Addressed This Visit       Genitourinary   Acute cystitis without hematuria - Primary   Relevant Orders   POCT URINALYSIS DIP (CLINITEK) (Completed)   Urine Culture   Meds ordered this encounter  Medications   nitrofurantoin, macrocrystal-monohydrate, (MACROBID) 100 MG capsule    Sig: Take 1 capsule (100 mg total) by mouth 2 (two) times daily.    Dispense:  20 capsule    Refill:  0    Order Specific Question:   Supervising Provider    AnswerCorey Harold    Orders Placed This Encounter  Procedures   Urine Culture   POCT URINALYSIS DIP (CLINITEK)     Follow-up: No follow-ups on file.  An After Visit Summary was printed and given to the patient.  Jettie Pagan [facility] [phone]

## 2022-04-25 ENCOUNTER — Other Ambulatory Visit: Payer: Self-pay | Admitting: Nurse Practitioner

## 2022-04-25 DIAGNOSIS — E663 Overweight: Secondary | ICD-10-CM

## 2022-04-25 MED ORDER — PHENTERMINE HCL 37.5 MG PO CAPS: 37.5000 mg | ORAL_CAPSULE | ORAL | 0 refills | Status: DC

## 2022-04-26 LAB — URINE CULTURE

## 2022-05-24 ENCOUNTER — Other Ambulatory Visit: Payer: Self-pay | Admitting: Nurse Practitioner

## 2022-05-24 DIAGNOSIS — E663 Overweight: Secondary | ICD-10-CM

## 2022-05-24 MED ORDER — PHENTERMINE HCL 37.5 MG PO CAPS: 37.5000 mg | ORAL_CAPSULE | ORAL | 0 refills | Status: DC

## 2022-05-28 ENCOUNTER — Encounter: Payer: Self-pay | Admitting: Nurse Practitioner

## 2022-05-28 ENCOUNTER — Ambulatory Visit (INDEPENDENT_AMBULATORY_CARE_PROVIDER_SITE_OTHER): Payer: Managed Care, Other (non HMO) | Admitting: Nurse Practitioner

## 2022-05-28 VITALS — BP 104/60 | HR 87 | Temp 97.0°F | Ht 64.0 in | Wt 144.0 lb

## 2022-05-28 DIAGNOSIS — L308 Other specified dermatitis: Secondary | ICD-10-CM | POA: Diagnosis not present

## 2022-05-28 DIAGNOSIS — E663 Overweight: Secondary | ICD-10-CM | POA: Diagnosis not present

## 2022-05-28 DIAGNOSIS — G35 Multiple sclerosis: Secondary | ICD-10-CM | POA: Diagnosis not present

## 2022-05-28 MED ORDER — PHENTERMINE HCL 37.5 MG PO CAPS: 37.5000 mg | ORAL_CAPSULE | ORAL | 0 refills | Status: DC

## 2022-05-28 NOTE — Patient Instructions (Signed)
Continue heart healthy diet Continue physical activity Follow-up in 51-months  Calorie Counting for Weight Loss Calories are units of energy. Your body needs a certain number of calories from food to keep going throughout the day. When you eat or drink more calories than your body needs, your body stores the extra calories mostly as fat. When you eat or drink fewer calories than your body needs, your body burns fat to get the energy it needs. Calorie counting means keeping track of how many calories you eat and drink each day. Calorie counting can be helpful if you need to lose weight. If you eat fewer calories than your body needs, you should lose weight. Ask your health care provider what a healthy weight is for you. For calorie counting to work, you will need to eat the right number of calories each day to lose a healthy amount of weight per week. A dietitian can help you figure out how many calories you need in a day and will suggest ways to reach your calorie goal. A healthy amount of weight to lose each week is usually 1-2 lb (0.5-0.9 kg). This usually means that your daily calorie intake should be reduced by 500-750 calories. Eating 1,200-1,500 calories a day can help most women lose weight. Eating 1,500-1,800 calories a day can help most men lose weight. What do I need to know about calorie counting? Work with your health care provider or dietitian to determine how many calories you should get each day. To meet your daily calorie goal, you will need to: Find out how many calories are in each food that you would like to eat. Try to do this before you eat. Decide how much of the food you plan to eat. Keep a food log. Do this by writing down what you ate and how many calories it had. To successfully lose weight, it is important to balance calorie counting with a healthy lifestyle that includes regular activity. Where do I find calorie information?  The number of calories in a food can be found  on a Nutrition Facts label. If a food does not have a Nutrition Facts label, try to look up the calories online or ask your dietitian for help. Remember that calories are listed per serving. If you choose to have more than one serving of a food, you will have to multiply the calories per serving by the number of servings you plan to eat. For example, the label on a package of bread might say that a serving size is 1 slice and that there are 90 calories in a serving. If you eat 1 slice, you will have eaten 90 calories. If you eat 2 slices, you will have eaten 180 calories. How do I keep a food log? After each time that you eat, record the following in your food log as soon as possible: What you ate. Be sure to include toppings, sauces, and other extras on the food. How much you ate. This can be measured in cups, ounces, or number of items. How many calories were in each food and drink. The total number of calories in the food you ate. Keep your food log near you, such as in a pocket-sized notebook or on an app or website on your mobile phone. Some programs will calculate calories for you and show you how many calories you have left to meet your daily goal. What are some portion-control tips? Know how many calories are in a serving. This will help  you know how many servings you can have of a certain food. Use a measuring cup to measure serving sizes. You could also try weighing out portions on a kitchen scale. With time, you will be able to estimate serving sizes for some foods. Take time to put servings of different foods on your favorite plates or in your favorite bowls and cups so you know what a serving looks like. Try not to eat straight from a food's packaging, such as from a bag or box. Eating straight from the package makes it hard to see how much you are eating and can lead to overeating. Put the amount you would like to eat in a cup or on a plate to make sure you are eating the right  portion. Use smaller plates, glasses, and bowls for smaller portions and to prevent overeating. Try not to multitask. For example, avoid watching TV or using your computer while eating. If it is time to eat, sit down at a table and enjoy your food. This will help you recognize when you are full. It will also help you be more mindful of what and how much you are eating. What are tips for following this plan? Reading food labels Check the calorie count compared with the serving size. The serving size may be smaller than what you are used to eating. Check the source of the calories. Try to choose foods that are high in protein, fiber, and vitamins, and low in saturated fat, trans fat, and sodium. Shopping Read nutrition labels while you shop. This will help you make healthy decisions about which foods to buy. Pay attention to nutrition labels for low-fat or fat-free foods. These foods sometimes have the same number of calories or more calories than the full-fat versions. They also often have added sugar, starch, or salt to make up for flavor that was removed with the fat. Make a grocery list of lower-calorie foods and stick to it. Cooking Try to cook your favorite foods in a healthier way. For example, try baking instead of frying. Use low-fat dairy products. Meal planning Use more fruits and vegetables. One-half of your plate should be fruits and vegetables. Include lean proteins, such as chicken, [place], and fish. Lifestyle Each week, aim to do one of the following: 150 minutes of moderate exercise, such as walking. 75 minutes of vigorous exercise, such as running. General information Know how many calories are in the foods you eat most often. This will help you calculate calorie counts faster. Find a way of tracking calories that works for you. Get creative. Try different apps or programs if writing down calories does not work for you. What foods should I eat?  Eat nutritious foods. It is  better to have a nutritious, high-calorie food, such as an avocado, than a food with few nutrients, such as a bag of potato chips. Use your calories on foods and drinks that will fill you up and will not leave you hungry soon after eating. Examples of foods that fill you up are nuts and nut butters, vegetables, lean proteins, and high-fiber foods such as whole grains. High-fiber foods are foods with more than 5 g of fiber per serving. Pay attention to calories in drinks. Low-calorie drinks include water and unsweetened drinks. The items listed above may not be a complete list of foods and beverages you can eat. Contact a dietitian for more information. What foods should I limit? Limit foods or drinks that are not good sources of vitamins,  minerals, or protein or that are high in unhealthy fats. These include: Candy. Other sweets. Sodas, specialty coffee drinks, alcohol, and juice. The items listed above may not be a complete list of foods and beverages you should avoid. Contact a dietitian for more information. How do I count calories when eating out? Pay attention to portions. Often, portions are much larger when eating out. Try these tips to keep portions smaller: Consider sharing a meal instead of getting your own. If you get your own meal, eat only half of it. Before you start eating, ask for a container and put half of your meal into it. When available, consider ordering smaller portions from the menu instead of full portions. Pay attention to your food and drink choices. Knowing the way food is cooked and what is included with the meal can help you eat fewer calories. If calories are listed on the menu, choose the lower-calorie options. Choose dishes that include vegetables, fruits, whole grains, low-fat dairy products, and lean proteins. Choose items that are boiled, broiled, grilled, or steamed. Avoid items that are buttered, battered, fried, or served with cream sauce. Items labeled as  crispy are usually fried, unless stated otherwise. Choose water, low-fat milk, unsweetened iced tea, or other drinks without added sugar. If you want an alcoholic beverage, choose a lower-calorie option, such as a glass of wine or light beer. Ask for dressings, sauces, and syrups on the side. These are usually high in calories, so you should limit the amount you eat. If you want a salad, choose a garden salad and ask for grilled meats. Avoid extra toppings such as bacon, cheese, or fried items. Ask for the dressing on the side, or ask for olive oil and vinegar or lemon to use as dressing. Estimate how many servings of a food you are given. Knowing serving sizes will help you be aware of how much food you are eating at restaurants. Where to find more information Centers for Disease Control and Prevention: FootballExhibition.com.br U.[place]. Department of Agriculture: WrestlingReporter.dk Summary Calorie counting means keeping track of how many calories you eat and drink each day. If you eat fewer calories than your body needs, you should lose weight. A healthy amount of weight to lose per week is usually 1-2 lb (0.5-0.9 kg). This usually means reducing your daily calorie intake by 500-750 calories. The number of calories in a food can be found on a Nutrition Facts label. If a food does not have a Nutrition Facts label, try to look up the calories online or ask your dietitian for help. Use smaller plates, glasses, and bowls for smaller portions and to prevent overeating. Use your calories on foods and drinks that will fill you up and not leave you hungry shortly after a meal. This information is not intended to replace advice given to you by your health care provider. Make sure you discuss any questions you have with your health care provider. Document Revised: 10/21/2019 Document Reviewed: 10/21/2019 Elsevier Patient Education  2023 [employer].

## 2022-05-28 NOTE — Progress Notes (Signed)
Subjective:  Patient ID: Lisa Wu, female    DOB: [birth year]  Age: 41 y.o. MRN: [id number]  CC: Weight management  HPI  Patient presents today for weight management. Starting weight was 174 lbs. Today weight 144 lbs, BMI 24.72 total weight loss 30 lbs. Reports being very active and drinking approx 80-100 oz of water daily. States she would like to lose 14 pounds.   Lisa Wu was diagnosed with MS at age 17. History of migraine headaches. Currently followed by neurology and prescribed Ocrevus IV twice yearly. Migraines treated with Imitrex and Gabapentin. Current Outpatient Medications on File Prior to Visit  Medication Sig Dispense Refill   ALPRAZolam (XANAX) 0.5 MG tablet Take 2-3 p.o. before MRI 3 tablet 0   aspirin-acetaminophen-caffeine (EXCEDRIN MIGRAINE) 250-250-65 MG tablet Take 1-2 tablets by mouth every 6 (six) hours as needed for headache.     cholecalciferol (VITAMIN D) 1000 units tablet Take 1,000 Units by mouth daily.     desonide (DESOWEN) 0.05 % cream Apply topically 2 (two) times daily. 30 g 0   gabapentin (NEURONTIN) 300 MG capsule Take up to 3 po daily 90 capsule 11   levonorgestrel (MIRENA, 52 MG,) 20 MCG/DAY IUD IUD     nitrofurantoin, macrocrystal-monohydrate, (MACROBID) 100 MG capsule Take 1 capsule (100 mg total) by mouth 2 (two) times daily. 20 capsule 0   ocrelizumab (OCREVUS) 300 MG/10ML injection Inject into the vein every 6 (six) months.     phentermine 37.5 MG capsule Take 1 capsule (37.5 mg total) by mouth every morning. 30 capsule 0   SUMAtriptan (IMITREX) 50 MG tablet sumatriptan 50 mg tablet  TAKE 1 TABLET BY MOUTH ONCE DAILY AS NEEDED FOR MIGRAINE. DO NOT TAKE MORE THAN 3 DAYS PER WEEK. MAX 200MG  PER DAY.     triamcinolone cream (KENALOG) 0.1 % Apply 1 application. topically 2 (two) times daily. 30 g 2   No current facility-administered medications on file prior to visit.   Past Medical History:  Diagnosis Date   Migraines    Multiple  sclerosis (HCC)    Neck pain    with numbness in hands   Optic neuritis    Personal history of COVID-19    Past Surgical History:  Procedure Laterality Date   OVARY SURGERY     polyp excision    Family History  Problem Relation Age of Onset   Diabetes Mother    Asthma Mother    Depression Mother    Alcoholism Father    Drug abuse Brother        Pain meds   Alcoholism Brother    Cirrhosis Brother    Social History   Socioeconomic History   Marital status: Married    Spouse name: Lisa Wu   Number of children: 2   Years of education: Not on file   Highest education level: Not on file  Occupational History   Occupation: Homemaker  Tobacco Use   Smoking status: Former    Packs/day: 0.50    Types: Cigarettes   Smokeless tobacco: Never  Vaping Use   Vaping Use: Former  Substance and Sexual Activity   Alcohol use: No   Drug use: No   Sexual activity: Yes    Partners: Male  Other Topics Concern   Not on file  Social History Narrative   Pt is married with 2 children.  Caffeine 2 servings daily.    Social Determinants of Health   Financial Resource Strain: Low Risk  (01/23/2022)  Overall Financial Resource Strain (CARDIA)    Difficulty of Paying Living Expenses: Not hard at all  Food Insecurity: No Food Insecurity (01/23/2022)   Hunger Vital Sign    Worried About Running Out of Food in the Last Year: Never true    Ran Out of Food in the Last Year: Never true  Transportation Needs: No Transportation Needs (01/23/2022)   PRAPARE - Administrator, Civil Service (Medical): No    Lack of Transportation (Non-Medical): No  Physical Activity: Insufficiently Active (01/23/2022)   Exercise Vital Sign    Days of Exercise per Week: 3 days    Minutes of Exercise per Session: 20 min  Stress: No Stress Concern Present (01/23/2022)   [facility] [employer] - Occupational Stress Questionnaire    Feeling of Stress : Not at all  Social Connections:  Moderately Integrated (01/23/2022)   Social Connection and Isolation Panel [NHANES]    Frequency of Communication with Friends and Family: More than three times a week    Frequency of Social Gatherings with Friends and Family: More than three times a week    Attends Religious Services: More than 4 times per year    Active Member of [employer] or Organizations: No    Attends Banker Meetings: Never    Marital Status: Married    Review of Systems  Skin:  Positive for rash (Eczema, chronic).     Objective:  BP 104/60   Pulse 87   Temp (!) 97 F (36.1 C)   Ht 5\' 4"  (1.626 m)   Wt 144 lb (65.3 kg)   SpO2 100%   BMI 24.72 kg/m       04/23/2022    1:43 PM 02/20/2022   10:59 AM 01/23/2022    9:58 AM  BP/Weight  Systolic BP 104 112 128  Diastolic BP 66 80 68  Wt. (Lbs) 148.6 164.4 174  BMI 25.51 kg/m2 28.22 kg/m2 29.87 kg/m2    Physical Exam Vitals reviewed.  Constitutional:      Appearance: Normal appearance.  Cardiovascular:     Rate and Rhythm: Normal rate and regular rhythm.     Pulses: Normal pulses.     Heart sounds: Normal heart sounds.  Pulmonary:     Effort: Pulmonary effort is normal.     Breath sounds: Normal breath sounds.  Skin:    General: Skin is warm.     Capillary Refill: Capillary refill takes less than 2 seconds.     Findings: Rash present.          Comments: Eczema rash to right antecubital space  Neurological:     General: No focal deficit present.     Mental Status: She is alert and oriented to person, place, and time.    Lab Results  Component Value Date   WBC 8.2 12/06/2021   HGB 13.0 12/06/2021   HCT 39.9 12/06/2021   PLT 384 12/06/2021   GLUCOSE 111 (H) 12/07/2015   NA 140 12/07/2015   K 3.7 12/07/2015   CL 101 12/07/2015   CREATININE 0.64 12/07/2015   BUN 13 12/07/2015   CO2 26 12/07/2015   HGBA1C 5.4 12/08/2015      Assessment & Plan:   1. Multiple sclerosis (HCC)-well controlled -continue Ocrevus as  prescribed -keep scheduled appt with neurology  2. Overweight (BMI 25.0-29.9) - phentermine 37.5 MG capsule; Take 1 capsule (37.5 mg total) by mouth every morning.  Dispense: 30 capsule; Refill: 0 -will decrease to  15 mg at next visit for taper to prevent rebound weight gain  3. Other eczema-well controlled -continue Triamcinolone cream to rash as needed   Continue heart healthy diet Continue physical activity Follow-up in 79-months     Follow-up: 35-months  An After Visit Summary was printed and given to the patient.  I, Janie Morning, NP, have reviewed all documentation for this visit. The documentation on 05/28/22 for the exam, diagnosis, procedures, and orders are all accurate and complete.    Signed, Janie Morning, NP [facility] [phone]

## 2022-05-29 ENCOUNTER — Other Ambulatory Visit: Payer: Self-pay

## 2022-05-29 DIAGNOSIS — E663 Overweight: Secondary | ICD-10-CM

## 2022-05-29 MED ORDER — PHENTERMINE HCL 37.5 MG PO CAPS: 37.5000 mg | ORAL_CAPSULE | ORAL | 0 refills | Status: DC

## 2022-05-30 ENCOUNTER — Other Ambulatory Visit: Payer: Self-pay | Admitting: Family Medicine

## 2022-05-30 DIAGNOSIS — E663 Overweight: Secondary | ICD-10-CM

## 2022-06-17 ENCOUNTER — Ambulatory Visit: Payer: 59 | Admitting: Neurology

## 2022-06-17 ENCOUNTER — Encounter: Payer: Self-pay | Admitting: Neurology

## 2022-06-17 VITALS — BP 107/71 | HR 86 | Ht 64.0 in | Wt 141.8 lb

## 2022-06-17 DIAGNOSIS — G35 Multiple sclerosis: Secondary | ICD-10-CM | POA: Diagnosis not present

## 2022-06-17 DIAGNOSIS — R252 Cramp and spasm: Secondary | ICD-10-CM

## 2022-06-17 DIAGNOSIS — Z79899 Other long term (current) drug therapy: Secondary | ICD-10-CM

## 2022-06-17 DIAGNOSIS — R5383 Other fatigue: Secondary | ICD-10-CM

## 2022-06-17 DIAGNOSIS — R269 Unspecified abnormalities of gait and mobility: Secondary | ICD-10-CM

## 2022-06-17 DIAGNOSIS — R3911 Hesitancy of micturition: Secondary | ICD-10-CM | POA: Diagnosis not present

## 2022-06-17 DIAGNOSIS — R29898 Other symptoms and signs involving the musculoskeletal system: Secondary | ICD-10-CM | POA: Diagnosis not present

## 2022-06-17 MED ORDER — BACLOFEN 10 MG PO TABS: 10.0000 mg | ORAL_TABLET | Freq: Every day | ORAL | 11 refills | Status: DC

## 2022-06-17 NOTE — Progress Notes (Signed)
[facility]  PATIENT: Lisa Wu DOB: [birth year]  REFERRING DOCTOR OR PCP: Dr. George Hugh ([facility]) SOURCE: Patient, notes from [facility], imaging and lab reports, MRI images personally reviewed.  _________________________________   HISTORICAL  CHIEF COMPLAINT:  Chief Complaint  Patient presents with   Follow-up    Pt in room #2 with her daughter. Pt her today for f/u.    HISTORY OF PRESENT ILLNESS:  Lisa Wu is a 41 y.o.o woman with relapsing multiple sclerosis  Update 06/17/2022: She is Ocrevus ad tolerates it well.   She denies exacerbation or new symptoms.   I reviewedher 12/2021 MRIs and she has no new lesions.    Currently, she feels her MS is stable.   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   SNo fall since last visit   She needs to hold the bannister.   The right leg spasticity has slowly worsened.   She once had a right AFO.    She often needs to grab a wall or furniture while walking.   She does not use a cane or walker.   She has some spasticity, R>L and sometimes jerks the legs.    Sometimes she needs to get out of bed to get comfortable.    She has a Lhermitte sign and some dysesthesias in limbs.    Gabapentin 600 mg po qHS has helped at night and the daytime sensations are not too annoying.   She has urinary hesitancy despite also having urgency.   She often needs to go multiple times in a row.   She has never been on tamsulosin or other bladder medication.     Her vision is ok most of the time though she has occasional diplopia or visual blurring.   Her left eye seems to have more blurriness.  She has fatigue associated with her MS.   Sleep is non-restorative many nights.   She snores but has not been told she has gasps, snorts or other OSA signs.   She is both sleepy and tired during the day.   Leg movements sometimes makes sleep harder.    She takes  a nap every mid morning but is not falling asleep watching TV, reading or at church.   She is on Ritalin prn (rarely now)  and it helps her sleepiness and to be more active, less fatigued.She denies depression.   She is on phentermine which has helped her fatigue and somnolence as well as weight loss.      MS history: In 2000, she had the onset of a Lhermitte sign radiating to the arms and into her back.  This was evaluated by neurology with an MRI and a lumbar puncture and she was diagnosed with multiple sclerosis.  She was started on Betaseron.  She stopped in 2006 for a pregnancy.  She had noted some in the ejection site reactions and flulike reactions.  In 2005 she had a relapse with double vision that was treated with IV steroids.  After the pregnancy she started Rebif but had significant flulike symptoms and injection site reactions and stopped all disease modifying therapies in 2007.  Clinically she did fairly well with intermittent transient symptoms.  In December 2014 she developed right arm numbness and right hand up clumsiness.  She saw [facility] neurology and was treated with a 3-day course of IV Solu-Medrol with improvement that was incomplete.  Around that time a neurogenic bladder was also noted with hesitancy as well as frequency/urgency.  She had relapses in June 2016 and March 2017 but was not on therapy at the time.  She had a relapse with reduced sensation in both hands and clumsiness.   She had only partial benefit She had an active MRI of the brain May 2019 and she was initiated on Ocrevus.  MRI of the brain in March 2020 was stable.   She has no new symptoms since starting Ocrevus and tolerates it very well.     Her last infusion was August 2022 and she was supposed to get one in February but has not had it yet.     She has never been able to move her toes on the right foot.  She has severe muscle atrophy in the lower right leg.      She saw Dr, Sandria Manly many years ago.   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   MRI images personally reviewed: MRI of the brain 12/07/2015 showed multiple T2/STIR hyperintense foci in the periventricular, juxtacortical and deep white matter of the hemispheres.  Foci are also noted in the pons and in the right thalamus/internal capsule.  1 focus in the left parieto-occipital lobe enhanced after contrast.  MRI of the brain 01/31/2018 showed T2/FLAIR hyperintense foci in the left cerebellar hemisphere, left middle cerebellar peduncle, pons, right p thalamus/osterior limb of the internal capsule and in the periventricular, juxtacortical and deep white matter of the hemispheres.  1 focus in the left frontal lobe enhanced after contrast consistent with an acute demyelinating plaque.    Additionally, compared to the 2017 MRI there were several other new foci in the hemispheres and the focus in the cerebellum was noted.\\  MRI of the cervical spine 01/31/2018 showed multiple T2 hyperintense foci located to the left adjacent to C2-C3, centrally adjacent to C4, posterolaterally to the right adjacent to C5-C6, and centrally to the right adjacent to C7  MRI of the brain 12/12/2018 was essentially unchanged compared to the MRI from 01/31/2018 showing no definite new lesions and no enhancing foci.  MRI brain 01/02/2022 showed T2/FLAIR hyperintense foci in the cerebral hemispheres and a few foci in the pons, left middle cerebellar peduncle and left thalamus in a pattern consistent with chronic demyelinating plaque associated with multiple sclerosis.  None of the foci enhance or appear to be acute.  Compared to the MRI dated 01/31/2018, there are no new lesions   MRI cervical spine 01/02/2022 Multiple T2 hyperintense foci within the spinal cord as detailed above consistent with chronic demyelinating plaque associated with multiple sclerosis.  None of the foci enhance.  No definite changes compared to the MRI from 01/31/2018. 2.   Minimal disc  degenerative changes at C5-C6 and C6-C7 that do not lead to foraminal narrowing, spinal stenosis or nerve root compression.  REVIEW OF SYSTEMS: Constitutional: No fevers, chills, sweats, or change in appetite.  She has fatigue Eyes: No visual changes, double vision, eye pain Ear, nose and throat: No hearing loss, ear pain, nasal congestion, sore throat Cardiovascular: No chest pain, palpitations Respiratory:  No shortness of breath at rest or with exertion.   No wheezes GastrointestinaI: No nausea, vomiting, diarrhea, abdominal pain, fecal incontinence Genitourinary: She has urinary hesitancy as well as urgency. Musculoskeletal:  No neck pain, back pain Integumentary: No rash, pruritus, skin lesions Neurological: as above Psychiatric: No depression at this time.  No  anxiety Endocrine: No palpitations, diaphoresis, change in appetite, change in weigh or increased thirst Hematologic/Lymphatic:  No anemia, purpura, petechiae. Allergic/Immunologic: No itchy/runny eyes, nasal congestion, recent allergic reactions, rashes  ALLERGIES: No Known Allergies  HOME MEDICATIONS:  Current Outpatient Medications:    ALPRAZolam (XANAX) 0.5 MG tablet, Take 2-3 p.o. before MRI, Disp: 3 tablet, Rfl: 0   aspirin-acetaminophen-caffeine (EXCEDRIN MIGRAINE) 250-250-65 MG tablet, Take 1-2 tablets by mouth every 6 (six) hours as needed for headache., Disp: , Rfl:    baclofen (LIORESAL) 10 MG tablet, Take 1 tablet (10 mg total) by mouth at bedtime., Disp: 30 each, Rfl: 11   cholecalciferol (VITAMIN D) 1000 units tablet, Take 1,000 Units by mouth daily., Disp: , Rfl:    desonide (DESOWEN) 0.05 % cream, Apply topically 2 (two) times daily., Disp: 30 g, Rfl: 0   gabapentin (NEURONTIN) 300 MG capsule, Take up to 3 po daily, Disp: 90 capsule, Rfl: 11   levonorgestrel (MIRENA, 52 MG,) 20 MCG/DAY IUD, IUD, Disp: , Rfl:    ocrelizumab (OCREVUS) 300 MG/10ML injection, Inject into the vein every 6 (six) months., Disp: ,  Rfl:    phentermine 37.5 MG capsule, Take 1 capsule (37.5 mg total) by mouth every morning., Disp: 30 capsule, Rfl: 0   SUMAtriptan (IMITREX) 50 MG tablet, sumatriptan 50 mg tablet  TAKE 1 TABLET BY MOUTH ONCE DAILY AS NEEDED FOR MIGRAINE. DO NOT TAKE MORE THAN 3 DAYS PER WEEK. MAX 200MG  PER DAY., Disp: , Rfl:    triamcinolone cream (KENALOG) 0.1 %, Apply 1 application. topically 2 (two) times daily., Disp: 30 g, Rfl: 2  PAST MEDICAL HISTORY: Past Medical History:  Diagnosis Date   Migraines    Multiple sclerosis (HCC)    Neck pain    with numbness in hands   Optic neuritis    Personal history of COVID-19     PAST SURGICAL HISTORY: Past Surgical History:  Procedure Laterality Date   OVARY SURGERY     polyp excision    FAMILY HISTORY: Family History  Problem Relation Age of Onset   Diabetes Mother    Asthma Mother    Depression Mother    Alcoholism Father    Drug abuse Brother        Pain meds   Alcoholism Brother    Cirrhosis Brother     SOCIAL HISTORY:  Social History   Socioeconomic History   Marital status: Married    Spouse name: Diandra Hedeen   Number of children: 2   Years of education: Not on file   Highest education level: Not on file  Occupational History   Occupation: Homemaker  Tobacco Use   Smoking status: Former    Packs/day: 0.50    Types: Cigarettes   Smokeless tobacco: Never  Vaping Use   Vaping Use: Former  Substance and Sexual Activity   Alcohol use: No   Drug use: No   Sexual activity: Yes    Partners: Male  Other Topics Concern   Not on file  Social History Narrative   Pt is married with 2 children.  Caffeine 2 servings daily.    Social Determinants of Health   Financial Resource Strain: Low Risk  (01/23/2022)   Overall Financial Resource Strain (CARDIA)    Difficulty of Paying Living Expenses: Not hard at all  Food Insecurity: No Food Insecurity (01/23/2022)   Hunger Vital Sign    Worried About Running Out of Food in the Last  Year: Never true  Ran Out of Food in the Last Year: Never true  Transportation Needs: No Transportation Needs (01/23/2022)   PRAPARE - Administrator, Civil Service (Medical): No    Lack of Transportation (Non-Medical): No  Physical Activity: Insufficiently Active (01/23/2022)   Exercise Vital Sign    Days of Exercise per Week: 3 days    Minutes of Exercise per Session: 20 min  Stress: No Stress Concern Present (01/23/2022)   [employer] of Occupational Health - Occupational Stress Questionnaire    Feeling of Stress : Not at all  Social Connections: Moderately Integrated (01/23/2022)   Social Connection and Isolation Panel [NHANES]    Frequency of Communication with Friends and Family: More than three times a week    Frequency of Social Gatherings with Friends and Family: More than three times a week    Attends Religious Services: More than 4 times per year    Active Member of [employer] or Organizations: No    Attends Banker Meetings: Never    Marital Status: Married  Catering manager Violence: Not At Risk (01/23/2022)   Humiliation, Afraid, Rape, and Kick questionnaire    Fear of Current or Ex-Partner: No    Emotionally Abused: No    Physically Abused: No    Sexually Abused: No     PHYSICAL EXAM  Vitals:   06/17/22 1119  BP: 107/71  Pulse: 86  Weight: 141 lb 12.8 oz (64.3 kg)  Height: 5\' 4"  (1.626 m)    Body mass index is 24.34 kg/m.   General: The patient is well-developed and well-nourished and in no acute distress  HEENT:  Head is [place]/AT.  Sclera are anicteric.    Neck: No carotid bruits are noted.  The neck is nontender.  Cardiovascular: The heart has a regular rate and rhythm with a normal S1 and S2. There were no murmurs, gallops or rubs.    Skin: Extremities are without rash or  edema.  Musculoskeletal/limbs  Back is non-tender.  The left leg is slightly shorter than the left and her foot is mildly smaller  Neurologic Exam  Mental  status: The patient is alert and oriented x 3 at the time of the examination. The patient has apparent normal recent and remote memory, with an apparently normal attention span and concentration ability.   Speech is normal.  Cranial nerves: Extraocular movements are full.  Facial strength and sensation is normal.  No dysarthria.  No obvious hearing deficits are noted.  Motor:  Muscle bulk is mildly reduced in lower right leg.   Tone is increased in right leg. Strength is  5 / 5 in arms and left leg but 4/5 proximal right leg and 1/5 right ankle extension, 2/5 gastrocneiums. 0-1 toes.   Sensory: Sensory testing is intact to pinprick, soft touch and vibration sensation in all 4 extremities.  Coordination: Cerebellar testing reveals good finger-nose-finger and heel-to-shin bilaterally.  Gait and station: Station is normal.   Gait is wide with a right foot drop.  The tandem gait is poor. Romberg is negative.   Reflexes: Deep tendon reflexes are symmetric and normal bilaterally.   Plantar responses are flexor.    DIAGNOSTIC DATA (LABS, IMAGING, TESTING) - I reviewed patient records, labs, notes, testing and imaging myself where available.  Lab Results  Component Value Date   WBC 8.2 12/06/2021   HGB 13.0 12/06/2021   HCT 39.9 12/06/2021   MCV 88 12/06/2021   PLT 384 12/06/2021  Component Value Date/Time   NA 140 12/07/2015 2037   K 3.7 12/07/2015 2037   CL 101 12/07/2015 2037   CO2 26 12/07/2015 2037   GLUCOSE 111 (H) 12/07/2015 2037   BUN 13 12/07/2015 2037   CREATININE 0.64 12/07/2015 2037   CALCIUM 9.5 12/07/2015 2037   GFRNONAA >60 12/07/2015 2037   GFRAA >60 12/07/2015 2037   No results found for: "CHOL", "HDL", "LDLCALC", "LDLDIRECT", "TRIG", "CHOLHDL" Lab Results  Component Value Date   HGBA1C 5.4 12/08/2015   No results found for: "VITAMINB12" No results found for: "TSH"     ASSESSMENT AND PLAN  Multiple sclerosis (HCC) - Plan: IgG, IgA, IgM, CD20 B  Cells  High risk medication use - Plan: IgG, IgA, IgM, CD20 B Cells  Spasticity  Neurologic gait dysfunction  Urinary hesitancy  Right leg weakness  Other fatigue  Continue Ocrevus.  Check labs.   Ok to continue phentermine Try baclofen at night forleg spasms.  Return in 6 months or sooner if there are new or worsening neurologic symptoms.  Braison Snoke A. Epimenio Foot, MD, [facility] 06/17/2022, 12:04 PM Certified in Neurology, Clinical Neurophysiology, Sleep Medicine and Neuroimaging  [facility] [address], [address] [place], [place] [address] [phone]

## 2022-06-18 LAB — IGG, IGA, IGM
IgA/Immunoglobulin A, Serum: 131 mg/dL (ref 87–352)
IgG (Immunoglobin G), Serum: 882 mg/dL (ref 586–1602)
IgM (Immunoglobulin M), Srm: 45 mg/dL (ref 26–217)

## 2022-06-18 LAB — CD20 B CELLS
% CD19-B Cells: 0 % — ABNORMAL LOW (ref 4.6–22.1)
% CD20-B Cells: 0 % — ABNORMAL LOW (ref 5.0–22.3)

## 2022-06-25 DIAGNOSIS — G35 Multiple sclerosis: Secondary | ICD-10-CM | POA: Diagnosis not present

## 2022-06-26 DIAGNOSIS — Z01419 Encounter for gynecological examination (general) (routine) without abnormal findings: Secondary | ICD-10-CM | POA: Diagnosis not present

## 2022-06-26 DIAGNOSIS — R69 Illness, unspecified: Secondary | ICD-10-CM | POA: Diagnosis not present

## 2022-06-26 DIAGNOSIS — Z124 Encounter for screening for malignant neoplasm of cervix: Secondary | ICD-10-CM | POA: Diagnosis not present

## 2022-06-26 DIAGNOSIS — F5231 Female orgasmic disorder: Secondary | ICD-10-CM | POA: Insufficient documentation

## 2022-06-26 LAB — HM PAP SMEAR: HM Pap smear: NEGATIVE

## 2022-06-26 LAB — RESULTS CONSOLE HPV: CHL HPV: NEGATIVE

## 2022-06-27 ENCOUNTER — Other Ambulatory Visit: Payer: Self-pay | Admitting: Nurse Practitioner

## 2022-06-27 DIAGNOSIS — E663 Overweight: Secondary | ICD-10-CM

## 2022-07-01 ENCOUNTER — Other Ambulatory Visit: Payer: Self-pay | Admitting: Nurse Practitioner

## 2022-07-01 DIAGNOSIS — E663 Overweight: Secondary | ICD-10-CM

## 2022-07-01 MED ORDER — PHENTERMINE HCL 37.5 MG PO CAPS: 37.5000 mg | ORAL_CAPSULE | ORAL | 0 refills | Status: DC

## 2022-07-22 DIAGNOSIS — Z3202 Encounter for pregnancy test, result negative: Secondary | ICD-10-CM | POA: Diagnosis not present

## 2022-07-22 DIAGNOSIS — Z30430 Encounter for insertion of intrauterine contraceptive device: Secondary | ICD-10-CM | POA: Diagnosis not present

## 2022-07-30 DIAGNOSIS — Z30431 Encounter for routine checking of intrauterine contraceptive device: Secondary | ICD-10-CM | POA: Diagnosis not present

## 2022-07-30 DIAGNOSIS — Z30433 Encounter for removal and reinsertion of intrauterine contraceptive device: Secondary | ICD-10-CM | POA: Diagnosis not present

## 2022-08-01 ENCOUNTER — Other Ambulatory Visit: Payer: Self-pay | Admitting: Nurse Practitioner

## 2022-08-01 DIAGNOSIS — E663 Overweight: Secondary | ICD-10-CM

## 2022-08-02 MED ORDER — PHENTERMINE HCL 37.5 MG PO CAPS: 37.5000 mg | ORAL_CAPSULE | ORAL | 0 refills | Status: DC

## 2022-08-14 ENCOUNTER — Encounter: Payer: Self-pay | Admitting: Nurse Practitioner

## 2022-08-27 NOTE — Progress Notes (Unsigned)
Subjective:  Patient ID: Lisa Wu, female    DOB: 04/01/1981  Age: 41 y.o. MRN: [id number]  Chief Complaint  Patient presents with   Multiple Sclerosis   Weight management  Patient presents today for weight management. Starting weight was 174 lbs. Today weight 144 lbs, BMI 24.72 total weight loss 30 lbs. Reports being very active and drinking approx 80-100 oz of water daily. States she would like to lose 14 pounds.   Syanne was diagnosed with MS at age 58. History of migraine headaches. Currently followed by neurology and prescribed Ocrevus IV twice yearly. Migraines treated with Imitrex and Gabapentin. Previous HPI^^^  HPI      Current Outpatient Medications on File Prior to Visit  Medication Sig Dispense Refill   ALPRAZolam (XANAX) 0.5 MG tablet Take 2-3 p.o. before MRI 3 tablet 0   aspirin-acetaminophen-caffeine (EXCEDRIN MIGRAINE) 250-250-65 MG tablet Take 1-2 tablets by mouth every 6 (six) hours as needed for headache.     baclofen (LIORESAL) 10 MG tablet Take 1 tablet (10 mg total) by mouth at bedtime. 30 each 11   cholecalciferol (VITAMIN D) 1000 units tablet Take 1,000 Units by mouth daily.     desonide (DESOWEN) 0.05 % cream Apply topically 2 (two) times daily. 30 g 0   gabapentin (NEURONTIN) 300 MG capsule Take up to 3 po daily 90 capsule 11   levonorgestrel (MIRENA, 52 MG,) 20 MCG/DAY IUD IUD     ocrelizumab (OCREVUS) 300 MG/10ML injection Inject into the vein every 6 (six) months.     phentermine 37.5 MG capsule Take 1 capsule (37.5 mg total) by mouth every morning. 30 capsule 0   SUMAtriptan (IMITREX) 50 MG tablet sumatriptan 50 mg tablet  TAKE 1 TABLET BY MOUTH ONCE DAILY AS NEEDED FOR MIGRAINE. DO NOT TAKE MORE THAN 3 DAYS PER WEEK. MAX 200MG  PER DAY.     triamcinolone cream (KENALOG) 0.1 % Apply 1 application. topically 2 (two) times daily. 30 g 2   No current facility-administered medications on file prior to visit.   Past Medical History:   Diagnosis Date   Migraines    Multiple sclerosis (HCC)    Neck pain    with numbness in hands   Optic neuritis    Personal history of COVID-19    Past Surgical History:  Procedure Laterality Date   OVARY SURGERY     polyp excision    Family History  Problem Relation Age of Onset   Diabetes Mother    Asthma Mother    Depression Mother    Alcoholism Father    Drug abuse Brother        Pain meds   Alcoholism Brother    Cirrhosis Brother    Social History   Socioeconomic History   Marital status: Married    Spouse name: Rheda Kassab   Number of children: 2   Years of education: Not on file   Highest education level: Not on file  Occupational History   Occupation: Homemaker  Tobacco Use   Smoking status: Former    Packs/day: 0.50    Types: Cigarettes   Smokeless tobacco: Never  Vaping Use   Vaping Use: Former  Substance and Sexual Activity   Alcohol use: No   Drug use: No   Sexual activity: Yes    Partners: Male  Other Topics Concern   Not on file  Social History Narrative   Pt is married with 2 children.  Caffeine 2 servings daily.  Social Determinants of Health   Financial Resource Strain: Low Risk  (01/23/2022)   Overall Financial Resource Strain (CARDIA)    Difficulty of Paying Living Expenses: Not hard at all  Food Insecurity: No Food Insecurity (01/23/2022)   Hunger Vital Sign    Worried About Running Out of Food in the Last Year: Never true    Ran Out of Food in the Last Year: Never true  Transportation Needs: No Transportation Needs (01/23/2022)   PRAPARE - Administrator, Civil Service (Medical): No    Lack of Transportation (Non-Medical): No  Physical Activity: Insufficiently Active (01/23/2022)   Exercise Vital Sign    Days of Exercise per Week: 3 days    Minutes of Exercise per Session: 20 min  Stress: No Stress Concern Present (01/23/2022)   [employer] of Occupational Health - Occupational Stress Questionnaire    Feeling of  Stress : Not at all  Social Connections: Moderately Integrated (01/23/2022)   Social Connection and Isolation Panel [NHANES]    Frequency of Communication with Friends and Family: More than three times a week    Frequency of Social Gatherings with Friends and Family: More than three times a week    Attends Religious Services: More than 4 times per year    Active Member of [employer] or Organizations: No    Attends Banker Meetings: Never    Marital Status: Married    Review of Systems  Constitutional:  Negative for appetite change, fatigue and fever.  HENT:  Negative for congestion, ear pain, sinus pressure and sore throat.   Respiratory:  Negative for cough, chest tightness, shortness of breath and wheezing.   Cardiovascular:  Negative for chest pain and palpitations.  Gastrointestinal:  Negative for abdominal pain, constipation, diarrhea, nausea and vomiting.  Genitourinary:  Negative for dysuria and hematuria.  Musculoskeletal:  Negative for arthralgias, back pain, joint swelling and myalgias.  Skin:  Negative for rash.  Neurological:  Negative for dizziness, weakness and headaches.  Psychiatric/Behavioral:  Negative for dysphoric mood. The patient is not nervous/anxious.      Objective:  There were no vitals taken for this visit.     06/17/2022   11:19 AM 05/28/2022   11:15 AM 04/23/2022    1:43 PM  BP/Weight  Systolic BP 107 104 104  Diastolic BP 71 60 66  Wt. (Lbs) 141.8 144 148.6  BMI 24.34 kg/m2 24.72 kg/m2 25.51 kg/m2    Physical Exam Vitals reviewed.  Constitutional:      Appearance: Normal appearance. She is normal weight.  Cardiovascular:     Rate and Rhythm: Normal rate and regular rhythm.     Heart sounds: Normal heart sounds.  Pulmonary:     Effort: Pulmonary effort is normal.     Breath sounds: Normal breath sounds.  Abdominal:     General: Abdomen is flat. Bowel sounds are normal.     Palpations: Abdomen is soft.  Neurological:     Mental  Status: She is alert and oriented to person, place, and time.  Psychiatric:        Mood and Affect: Mood normal.        Behavior: Behavior normal.     Diabetic Foot Exam - Simple   No data filed      Lab Results  Component Value Date   WBC 8.2 12/06/2021   HGB 13.0 12/06/2021   HCT 39.9 12/06/2021   PLT 384 12/06/2021   GLUCOSE 111 (H) 12/07/2015  NA 140 12/07/2015   K 3.7 12/07/2015   CL 101 12/07/2015   CREATININE 0.64 12/07/2015   BUN 13 12/07/2015   CO2 26 12/07/2015   HGBA1C 5.4 12/08/2015      Assessment & Plan:   Problem List Items Addressed This Visit       Nervous and Auditory   Multiple sclerosis (HCC) - Primary   Other Visit Diagnoses     Overweight (BMI 25.0-29.9)       Other eczema         .  No orders of the defined types were placed in this encounter.   No orders of the defined types were placed in this encounter.    Follow-up: No follow-ups on file.  An After Visit Summary was printed and given to the patient.  Janie Morning, NP [facility] [phone]

## 2022-08-28 ENCOUNTER — Ambulatory Visit: Payer: Managed Care, Other (non HMO) | Admitting: Nurse Practitioner

## 2022-08-28 ENCOUNTER — Encounter: Payer: Self-pay | Admitting: Nurse Practitioner

## 2022-08-28 VITALS — BP 124/68 | HR 83 | Temp 97.2°F | Ht 64.0 in | Wt 137.0 lb

## 2022-08-28 DIAGNOSIS — L308 Other specified dermatitis: Secondary | ICD-10-CM | POA: Diagnosis not present

## 2022-08-28 DIAGNOSIS — G35 Multiple sclerosis: Secondary | ICD-10-CM

## 2022-08-28 DIAGNOSIS — E663 Overweight: Secondary | ICD-10-CM

## 2022-08-28 MED ORDER — PHENTERMINE HCL 15 MG PO CAPS: 15.0000 mg | ORAL_CAPSULE | ORAL | 0 refills | Status: DC

## 2022-08-28 NOTE — Patient Instructions (Signed)
Continue taking Phentermine 15  Trying to taper the dose slowly since you reached your goal  Keep up the great work with heart healthy diet and exercise.

## 2022-10-03 ENCOUNTER — Encounter: Payer: Self-pay | Admitting: Neurology

## 2022-10-07 ENCOUNTER — Other Ambulatory Visit: Payer: Self-pay | Admitting: Neurology

## 2022-10-07 MED ORDER — METHYLPHENIDATE HCL 10 MG PO TABS: 10.0000 mg | ORAL_TABLET | ORAL | 0 refills | Status: DC | PRN

## 2022-12-18 ENCOUNTER — Encounter: Payer: Self-pay | Admitting: Neurology

## 2022-12-18 ENCOUNTER — Telehealth: Payer: Self-pay | Admitting: Neurology

## 2022-12-18 ENCOUNTER — Ambulatory Visit: Payer: 59 | Admitting: Neurology

## 2022-12-18 VITALS — BP 101/64 | HR 79 | Ht 64.0 in | Wt 139.0 lb

## 2022-12-18 DIAGNOSIS — E663 Overweight: Secondary | ICD-10-CM | POA: Diagnosis not present

## 2022-12-18 DIAGNOSIS — Z79899 Other long term (current) drug therapy: Secondary | ICD-10-CM

## 2022-12-18 DIAGNOSIS — R269 Unspecified abnormalities of gait and mobility: Secondary | ICD-10-CM | POA: Diagnosis not present

## 2022-12-18 DIAGNOSIS — R208 Other disturbances of skin sensation: Secondary | ICD-10-CM | POA: Diagnosis not present

## 2022-12-18 DIAGNOSIS — H532 Diplopia: Secondary | ICD-10-CM | POA: Diagnosis not present

## 2022-12-18 DIAGNOSIS — E559 Vitamin D deficiency, unspecified: Secondary | ICD-10-CM | POA: Diagnosis not present

## 2022-12-18 DIAGNOSIS — R252 Cramp and spasm: Secondary | ICD-10-CM

## 2022-12-18 DIAGNOSIS — G35 Multiple sclerosis: Secondary | ICD-10-CM

## 2022-12-18 MED ORDER — GABAPENTIN 300 MG PO CAPS: ORAL_CAPSULE | ORAL | 11 refills | Status: DC

## 2022-12-18 MED ORDER — PHENTERMINE HCL 15 MG PO CAPS: 15.0000 mg | ORAL_CAPSULE | ORAL | 5 refills | Status: DC

## 2022-12-18 NOTE — Telephone Encounter (Signed)
Rhonda called from [facility]. Stated she needs to talk to nurse about pt BMI. Stated pt BMI is too low for her to fill prescription.

## 2022-12-18 NOTE — Progress Notes (Signed)
[facility]  PATIENT: Lisa Wu DOB: 12/14/1980  REFERRING DOCTOR OR PCP: Dr. George Hugh ([place] [facility]) SOURCE: Patient, notes from [facility], imaging and lab reports, MRI images personally reviewed.  _________________________________   HISTORICAL  CHIEF COMPLAINT:  Chief Complaint  Patient presents with   Follow-up    Pt in room 10, son in room. here for MS follow up. Pt states doing well. Pt mentioned when turning head she must turn slowly to avoid dizziness.eye sensitive to light .    HISTORY OF PRESENT ILLNESS:  Lisa Wu is a 42 y.o.o woman with relapsing multiple sclerosis  Update 12/18/2022: She is Ocrevus and tolerates it well.   She denies exacerbation or new symptoms.   I reviewedher 12/2021 MRIs and she has no new lesions.    Currently, she feels her MS is stable.   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   She has fallen a few times when she turns fast.    She needs to hold the bannister.  She can walk a mile but does not keep up with others well.   The right leg spasticity has slowly worsened.   She once had a right AFO.    She often needs to grab a wall or furniture while walking.   She does not use a cane or walker.   She has spasticity, R>L legs and sometimes jerks the legs.    This improved with higher dose of gabapentin  She has a Lhermitte sign and some dysesthesias in limbs.    Gabapentin 600 mg po qHS has helped at night and the daytime sensations are not too annoying.     She has urinary hesitancy despite also having urgency.   She often needs to go multiple times in a row.   She has never been on tamsulosin or other bladder medication.       Her vision is ok most of the time though she has occasional diplopia or visual blurring.   Her left eye seems to have more blurriness.   Fluorescent lights annoy her  She has fatigue associated with her MS.    Sleep is non-restorative many nights.   She snores but has not been told she has gasps, snorts or other OSA signs.   She is both sleepy and tired during the day.   Leg movements sometimes makes sleep harder.    She takes a nap every Sunday but not other days.    Phentermine helped her fatigue and somnolence as well as weight loss.  Ritalin had helped but she had trouble getting at pharmacy.    As on birth control,there would be an interaction with Provigil   MS history: In 2000, she had the onset of a Lhermitte sign radiating to the arms and into her back.  This was evaluated by neurology with an MRI and a lumbar puncture and she was diagnosed with multiple sclerosis.  She was started on Betaseron.  She stopped in 2006 for a pregnancy.  She had noted some in the ejection site reactions and flulike reactions.  In 2005 she had a relapse with double vision that was treated with IV steroids.  After the pregnancy she started Rebif but had significant flulike symptoms and injection site reactions and stopped all disease modifying therapies in 2007.  Clinically she did fairly well with intermittent transient symptoms.  In December 2014 she developed  right arm numbness and right hand up clumsiness.  She saw [facility] neurology and was treated with a 3-day course of IV Solu-Medrol with improvement that was incomplete.  Around that time a neurogenic bladder was also noted with hesitancy as well as frequency/urgency.  She had relapses in June 2016 and March 2017 but was not on therapy at the time.  She had a relapse with reduced sensation in both hands and clumsiness.   She had only partial benefit She had an active MRI of the brain May 2019 and she was initiated on Ocrevus.  MRI of the brain in March 2020 was stable.   She has no new symptoms since starting Ocrevus and tolerates it very well.     Her last infusion was August 2022 and she was supposed to get one in February but has not had it yet.     She has never  been able to move her toes on the right foot.  She has severe muscle atrophy in the lower right leg.      She saw Dr, Erling Cruz many years ago.   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   MRI images personally reviewed: MRI of the brain 12/07/2015 showed multiple T2/STIR hyperintense foci in the periventricular, juxtacortical and deep white matter of the hemispheres.  Foci are also noted in the pons and in the right thalamus/internal capsule.  1 focus in the left parieto-occipital lobe enhanced after contrast.  MRI of the brain 01/31/2018 showed T2/FLAIR hyperintense foci in the left cerebellar hemisphere, left middle cerebellar peduncle, pons, right p thalamus/osterior limb of the internal capsule and in the periventricular, juxtacortical and deep white matter of the hemispheres.  1 focus in the left frontal lobe enhanced after contrast consistent with an acute demyelinating plaque.    Additionally, compared to the 2017 MRI there were several other new foci in the hemispheres and the focus in the cerebellum was noted.\\  MRI of the cervical spine 01/31/2018 showed multiple T2 hyperintense foci located to the left adjacent to C2-C3, centrally adjacent to C4, posterolaterally to the right adjacent to C5-C6, and centrally to the right adjacent to C7  MRI of the brain 12/12/2018 was essentially unchanged compared to the MRI from 01/31/2018 showing no definite new lesions and no enhancing foci.  MRI brain 01/02/2022 showed T2/FLAIR hyperintense foci in the cerebral hemispheres and a few foci in the pons, left middle cerebellar peduncle and left thalamus in a pattern consistent with chronic demyelinating plaque associated with multiple sclerosis.  None of the foci enhance or appear to be acute.  Compared to the MRI dated 01/31/2018, there are no new lesions  MRI cervical spine 01/02/2022 Multiple T2 hyperintense foci within the spinal cord as detailed above consistent with chronic demyelinating  plaque associated with multiple sclerosis.  None of the foci enhance.  No definite changes compared to the MRI from 01/31/2018. 2.   Minimal disc degenerative changes at C5-C6 and C6-C7 that do not lead to foraminal narrowing, spinal stenosis or nerve root compression.  REVIEW OF SYSTEMS: Constitutional: No fevers, chills, sweats, or change in appetite.  She has fatigue Eyes: No visual changes, double vision, eye pain Ear, nose and throat: No hearing loss, ear pain, nasal congestion, sore throat Cardiovascular: No chest pain, palpitations Respiratory:  No shortness of breath at rest or with exertion.   No wheezes GastrointestinaI: No nausea, vomiting, diarrhea, abdominal pain, fecal incontinence Genitourinary: She has urinary  hesitancy as well as urgency. Musculoskeletal:  No neck pain, back pain Integumentary: No rash, pruritus, skin lesions Neurological: as above Psychiatric: No depression at this time.  No anxiety Endocrine: No palpitations, diaphoresis, change in appetite, change in weigh or increased thirst Hematologic/Lymphatic:  No anemia, purpura, petechiae. Allergic/Immunologic: No itchy/runny eyes, nasal congestion, recent allergic reactions, rashes  ALLERGIES: No Known Allergies  HOME MEDICATIONS:  Current Outpatient Medications:    ALPRAZolam (XANAX) 0.5 MG tablet, Take 2-3 p.o. before MRI, Disp: 3 tablet, Rfl: 0   aspirin-acetaminophen-caffeine (EXCEDRIN MIGRAINE) 250-250-65 MG tablet, Take 1-2 tablets by mouth every 6 (six) hours as needed for headache., Disp: , Rfl:    cholecalciferol (VITAMIN D) 1000 units tablet, Take 1,000 Units by mouth daily., Disp: , Rfl:    desonide (DESOWEN) 0.05 % cream, Apply topically 2 (two) times daily., Disp: 30 g, Rfl: 0   levonorgestrel (MIRENA, 52 MG,) 20 MCG/DAY IUD, IUD, Disp: , Rfl:    ocrelizumab (OCREVUS) 300 MG/10ML injection, Inject into the vein every 6 (six) months., Disp: , Rfl:    SUMAtriptan (IMITREX) 50 MG tablet, sumatriptan  50 mg tablet  TAKE 1 TABLET BY MOUTH ONCE DAILY AS NEEDED FOR MIGRAINE. DO NOT TAKE MORE THAN 3 DAYS PER WEEK. MAX 200MG  PER DAY., Disp: , Rfl:    triamcinolone cream (KENALOG) 0.1 %, Apply 1 application. topically 2 (two) times daily., Disp: 30 g, Rfl: 2   gabapentin (NEURONTIN) 300 MG capsule, Take up to 3 po daily, Disp: 90 capsule, Rfl: 11   phentermine 15 MG capsule, Take 1 capsule (15 mg total) by mouth every morning., Disp: 30 capsule, Rfl: 5  PAST MEDICAL HISTORY: Past Medical History:  Diagnosis Date   Migraines    Multiple sclerosis ([place])    Neck pain    with numbness in hands   Optic neuritis    Personal history of COVID-19     PAST SURGICAL HISTORY: Past Surgical History:  Procedure Laterality Date   OVARY SURGERY     polyp excision    FAMILY HISTORY: Family History  Problem Relation Age of Onset   Diabetes Mother    Asthma Mother    Depression Mother    Alcoholism Father    Drug abuse Brother        Pain meds   Alcoholism Brother    Cirrhosis Brother     SOCIAL HISTORY:  Social History   Socioeconomic History   Marital status: Married    Spouse name: Dariann Desai   Number of children: 2   Years of education: Not on file   Highest education level: Not on file  Occupational History   Occupation: Homemaker  Tobacco Use   Smoking status: Former    Packs/day: .5    Types: Cigarettes   Smokeless tobacco: Never  Vaping Use   Vaping Use: Former  Substance and Sexual Activity   Alcohol use: No   Drug use: No   Sexual activity: Yes    Partners: Male  Other Topics Concern   Not on file  Social History Narrative   Pt is married with 2 children.  Caffeine 2 servings daily.    Social Determinants of Health   Financial Resource Strain: Low Risk  (01/23/2022)   Overall Financial Resource Strain (CARDIA)    Difficulty of Paying Living Expenses: Not hard at all  Food Insecurity: No Food Insecurity (01/23/2022)   Hunger Vital Sign    Worried About Running  Out of Food in the  Last Year: Never true    [place] in the Last Year: Never true  Transportation Needs: No Transportation Needs (01/23/2022)   PRAPARE - Hydrologist (Medical): No    Lack of Transportation (Non-Medical): No  Physical Activity: Insufficiently Active (01/23/2022)   Exercise Vital Sign    Days of Exercise per Week: 3 days    Minutes of Exercise per Session: 20 min  Stress: No Stress Concern Present (01/23/2022)   [place]    Feeling of Stress : Not at all  Social Connections: Moderately Integrated (01/23/2022)   Social Connection and Isolation Panel [NHANES]    Frequency of Communication with Friends and Family: More than three times a week    Frequency of Social Gatherings with Friends and Family: More than three times a week    Attends Religious Services: More than 4 times per year    Active Member of [employer] or Organizations: No    Attends Archivist Meetings: Never    Marital Status: Married  Human resources officer Violence: Not At Risk (01/23/2022)   Humiliation, Afraid, Rape, and Kick questionnaire    Fear of Current or Ex-Partner: No    Emotionally Abused: No    Physically Abused: No    Sexually Abused: No     PHYSICAL EXAM  Vitals:   12/18/22 1120  BP: 101/64  Pulse: 79  Weight: 139 lb (63 kg)  Height: 5\' 4"  (1.626 m)    Body mass index is 23.86 kg/m.   General: The patient is well-developed and well-nourished and in no acute distress  HEENT:  Head is [place]/AT.  Sclera are anicteric.    Neck: No carotid bruits are noted.  The neck is nontender.  Cardiovascular: The heart has a regular rate and rhythm with a normal S1 and S2. There were no murmurs, gallops or rubs.    Skin: Extremities are without rash or  edema.  Musculoskeletal/limbs  Back is non-tender.  The left leg is slightly shorter than the left and her foot is mildly smaller  Neurologic  Exam  Mental status: The patient is alert and oriented x 3 at the time of the examination. The patient has apparent normal recent and remote memory, with an apparently normal attention span and concentration ability.   Speech is normal.  Cranial nerves: Extraocular movements are full.  Facial strength and sensation is normal.  No dysarthria.  No obvious hearing deficits are noted.  Motor:  Muscle bulk is mildly reduced in lower right leg.   Tone is increased in right leg. Strength is  5 / 5 in arms and left leg but 4/5 proximal right leg and 1/5 right ankle extension, 2/5 gastrocneiums. 0-1 toes.   Sensory: Sensory testing is intact to pinprick, soft touch and vibration sensation in all 4 extremities.  Coordination: Cerebellar testing reveals good finger-nose-finger and heel-to-shin bilaterally.  Gait and station: Station is normal.   Gait is wide with a right foot drop.  The tandem gait is poor. Romberg is negative.   Reflexes: Deep tendon reflexes are symmetric and normal bilaterally.   Plantar responses are flexor.    DIAGNOSTIC DATA (LABS, IMAGING, TESTING) - I reviewed patient records, labs, notes, testing and imaging myself where available.  Lab Results  Component Value Date   WBC 8.2 12/06/2021   HGB 13.0 12/06/2021   HCT 39.9 12/06/2021   MCV 88 12/06/2021   PLT 384  12/06/2021      Component Value Date/Time   NA 140 12/07/2015 2037   K 3.7 12/07/2015 2037   CL 101 12/07/2015 2037   CO2 26 12/07/2015 2037   GLUCOSE 111 (H) 12/07/2015 2037   BUN 13 12/07/2015 2037   CREATININE 0.64 12/07/2015 2037   CALCIUM 9.5 12/07/2015 2037   GFRNONAA >60 12/07/2015 2037   GFRAA >60 12/07/2015 2037   No results found for: "CHOL", "HDL", "LDLCALC", "LDLDIRECT", "TRIG", "CHOLHDL" Lab Results  Component Value Date   HGBA1C 5.4 12/08/2015   No results found for: "VITAMINB12" No results found for: "TSH"     ASSESSMENT AND PLAN  Multiple sclerosis ([place]) - Plan: IgG, IgA, IgM,  CD20 B Cells  Overweight (BMI 25.0-29.9) - Plan: phentermine 15 MG capsule  High risk medication use - Plan: IgG, IgA, IgM, CD20 B Cells  Dysesthesia  Double vision  Spasticity  Neurologic gait dysfunction  Vitamin D deficiency - Plan: VITAMIN D 25 Hydroxy (Vit-D Deficiency, Fractures)  Continue Ocrevus.  Check labs.   Ok to continue phentermine, will renew Continue gabapentin for RLS/spasms Return in 6 months or sooner if there are new or worsening neurologic symptoms.  Asim Gersten A. Felecia Shelling, MD, [facility] [id number], [id number] AM Certified in Neurology, Clinical Neurophysiology, Sleep Medicine and Neuroimaging  [facility] [address], [place], [place] [address] [phone]

## 2022-12-18 NOTE — Telephone Encounter (Signed)
I called [facility] and spoke Makayla (pharmacist) regarding the below note. I informed per note 12/18/22 " Phentermine helped her fatigue and somnolence as well as weight loss.   Makayla verbalized she understood. Makayla said she just needed more information.

## 2022-12-20 LAB — IGG, IGA, IGM
IgA/Immunoglobulin A, Serum: 128 mg/dL (ref 87–352)
IgG (Immunoglobin G), Serum: 931 mg/dL (ref 586–1602)
IgM (Immunoglobulin M), Srm: 45 mg/dL (ref 26–217)

## 2022-12-20 LAB — CD20 B CELLS
% CD19-B Cells: 0.2 % — ABNORMAL LOW (ref 4.6–22.1)
% CD20-B Cells: 0.1 % — ABNORMAL LOW (ref 5.0–22.3)

## 2022-12-20 LAB — VITAMIN D 25 HYDROXY (VIT D DEFICIENCY, FRACTURES): Vit D, 25-Hydroxy: 34.2 ng/mL (ref 30.0–100.0)

## 2022-12-24 DIAGNOSIS — G35 Multiple sclerosis: Secondary | ICD-10-CM | POA: Diagnosis not present

## 2022-12-31 ENCOUNTER — Encounter: Payer: Self-pay | Admitting: Neurology

## 2023-02-20 DIAGNOSIS — Z0000 Encounter for general adult medical examination without abnormal findings: Secondary | ICD-10-CM | POA: Diagnosis not present

## 2023-02-20 DIAGNOSIS — R079 Chest pain, unspecified: Secondary | ICD-10-CM | POA: Diagnosis not present

## 2023-02-20 DIAGNOSIS — R0602 Shortness of breath: Secondary | ICD-10-CM | POA: Diagnosis not present

## 2023-02-20 DIAGNOSIS — Z743 Need for continuous supervision: Secondary | ICD-10-CM | POA: Diagnosis not present

## 2023-02-20 DIAGNOSIS — R0781 Pleurodynia: Secondary | ICD-10-CM | POA: Diagnosis not present

## 2023-02-20 DIAGNOSIS — R748 Abnormal levels of other serum enzymes: Secondary | ICD-10-CM | POA: Diagnosis not present

## 2023-02-20 DIAGNOSIS — R945 Abnormal results of liver function studies: Secondary | ICD-10-CM | POA: Diagnosis not present

## 2023-02-20 DIAGNOSIS — R0789 Other chest pain: Secondary | ICD-10-CM | POA: Diagnosis not present

## 2023-06-17 ENCOUNTER — Other Ambulatory Visit: Payer: Self-pay

## 2023-06-17 ENCOUNTER — Encounter: Payer: Self-pay | Admitting: Neurology

## 2023-06-17 DIAGNOSIS — E663 Overweight: Secondary | ICD-10-CM

## 2023-06-17 MED ORDER — PHENTERMINE HCL 15 MG PO CAPS: 15.0000 mg | ORAL_CAPSULE | ORAL | 5 refills | Status: DC

## 2023-06-17 NOTE — Telephone Encounter (Signed)
Requested Prescriptions   Pending Prescriptions Disp Refills   phentermine 15 MG capsule 30 capsule 5    Sig: Take 1 capsule (15 mg total) by mouth every morning.   Last seen 12/18/22, next appt 07/02/23.  Dispenses  Routing to provider to fill Dispensed Days Supply Quantity Provider Pharmacy  PHENTERMINE 15MG     CAP 05/20/2023 30 30 each Sater, Pearletha Furl, MD [facility] [phone] ...  PHENTERMINE 15MG     CAP 04/18/2023 30 30 each Asa Lente, MD [facility] [phone] ...  PHENTERMINE 15MG     CAP 03/19/2023 30 30 each Asa Lente, MD [facility] [phone] ...  PHENTERMINE 15MG     CAP 02/15/2023 30 30 each Asa Lente, MD [facility] [phone] ...  PHENTERMINE 15MG     CAP 01/17/2023 30 30 each Asa Lente, MD [facility] [phone] ...  PHENTERMINE 15MG     CAP 12/18/2022 30 30 each Asa Lente, MD [facility] [phone] ...  PHENTERMINE 15MG     CAP 08/28/2022 30 30 each Lurline Del, FNP [facility] [phone] ...  PHENTERMINE 37.5MG   CAP 08/02/2022 30 30 each Janie Morning, NP [facility] [phone] ...  PHENTERMINE 37.5MG   CAP 07/01/2022 30 30 each Janie Morning, NP [facility] [phone] .Marland KitchenMarland Kitchen

## 2023-06-24 DIAGNOSIS — G35 Multiple sclerosis: Secondary | ICD-10-CM | POA: Diagnosis not present

## 2023-07-02 ENCOUNTER — Encounter: Payer: Self-pay | Admitting: Neurology

## 2023-07-02 ENCOUNTER — Ambulatory Visit: Payer: 59 | Admitting: Neurology

## 2023-07-02 VITALS — BP 94/61 | HR 80 | Ht 64.0 in | Wt 134.0 lb

## 2023-07-02 DIAGNOSIS — R3911 Hesitancy of micturition: Secondary | ICD-10-CM

## 2023-07-02 DIAGNOSIS — Z79899 Other long term (current) drug therapy: Secondary | ICD-10-CM | POA: Diagnosis not present

## 2023-07-02 DIAGNOSIS — R252 Cramp and spasm: Secondary | ICD-10-CM

## 2023-07-02 DIAGNOSIS — R269 Unspecified abnormalities of gait and mobility: Secondary | ICD-10-CM | POA: Diagnosis not present

## 2023-07-02 DIAGNOSIS — R29898 Other symptoms and signs involving the musculoskeletal system: Secondary | ICD-10-CM

## 2023-07-02 DIAGNOSIS — G35 Multiple sclerosis: Secondary | ICD-10-CM | POA: Diagnosis not present

## 2023-07-02 MED ORDER — GABAPENTIN 300 MG PO CAPS: ORAL_CAPSULE | ORAL | 4 refills | Status: DC

## 2023-07-02 NOTE — Progress Notes (Signed)
[facility]  PATIENT: Lisa Wu DOB: 04/16/1981  REFERRING DOCTOR OR PCP: Dr. Gaynelle Adu ([facility] [place] [facility]) SOURCE: Patient, notes from [facility], imaging and lab reports, MRI images personally reviewed.  _________________________________   HISTORICAL  CHIEF COMPLAINT:  Chief Complaint  Patient presents with   Follow-up    Pt in room 10 alone. Here for MS follow up on Ocrevus. Pt reports doing well.,    HISTORY OF PRESENT ILLNESS:  Lisa Wu is a 42 y.o.o woman with relapsing multiple sclerosis  Update 07/01/2023: She is Ocrevus and tolerates it well.  Last infusion was early October 2024.   Labs before previous infusion showed 0.2% B cells and normal IgG and IgM.Marland Kitchen   She denies exacerbation or new symptoms.  12/2021 MRIs showed no new lesions.    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   She has fallen a few times when she turns fast.    She needs to hold the bannister.  She can walk a mile but does not keep up with others well.  She reports more right leg spasticity    She once had a right AFO and we had discussed getting another one in the past.  She often gets ankle pain due to the right leg inverting.    She often needs to grab a wall or furniture while walking but does not use a cane or walker.     She has a Lhermitte sign chronically and some dysesthesias in limbs.    Gabapentin 600 mg po qHS has helped at night and the daytime sensations are not too annoying.   Gabapentin also help right leg jerking at night.     She has urinary hesitancy despite also having urgency.   She often needs to go multiple times in a row - less now thant she tries to sit longer.   No recent UTI but had in past.  We discussed tamsulosin if this worsens.       Her vision is ok occasional diplopia when she turns her head.  Her left eye seems to have more blurriness but good color  vision.   Fluorescent lights annoy her  She has fatigue associated with her MS.   Sleep is non-restorative many nights.   She snores but has not been told she has gasps, snorts or other OSA signs.   She is both sleepy and tired during the day.   Leg movements sometimes makes sleep harder.    She takes a nap every Sunday but not other days.    Phentermine has helped her fatigue and somnolence as well as weight loss.  Ritalin had helped but she had trouble getting at pharmacy.    As on birth control,there would be an interaction with Provigil  She has panic attacks, often triggered by driving.  She has had 2 bad episodes lasting a few hours once and 10 minutes for another one.  She went to the ED and her heart was fine   MS history: In 2000, she had the onset of a Lhermitte sign radiating to the arms and into her back.  This was evaluated by neurology with an MRI and a lumbar puncture and she was diagnosed with multiple sclerosis.  She was started on Betaseron.  She stopped in 2006 for a pregnancy.  She had noted some in the ejection site reactions and flulike reactions.  In  2005 she had a relapse with double vision that was treated with IV steroids.  After the pregnancy she started Rebif but had significant flulike symptoms and injection site reactions and stopped all disease modifying therapies in 2007.  Clinically she did fairly well with intermittent transient symptoms.  In December 2014 she developed right arm numbness and right hand up clumsiness.  She saw [facility] neurology and was treated with a 3-day course of IV Solu-Medrol with improvement that was incomplete.  Around that time a neurogenic bladder was also noted with hesitancy as well as frequency/urgency.  She had relapses in June 2016 and March 2017 but was not on therapy at the time.  She had a relapse with reduced sensation in both hands and clumsiness.   She had only partial benefit She had an active MRI of the brain May 2019 and she was  initiated on Ocrevus.  MRI of the brain in March 2020 was stable.   She has no new symptoms since starting Ocrevus and tolerates it very well.     Her last infusion was August 2022 and she was supposed to get one in February but has not had it yet.     She has never been able to move her toes on the right foot.  She has severe muscle atrophy in the lower right leg.       MRI images personally reviewed: MRI of the brain 12/07/2015 showed multiple T2/STIR hyperintense foci in the periventricular, juxtacortical and deep white matter of the hemispheres.  Foci are also noted in the pons and in the right thalamus/internal capsule.  1 focus in the left parieto-occipital lobe enhanced after contrast.  MRI of the brain 01/31/2018 showed T2/FLAIR hyperintense foci in the left cerebellar hemisphere, left middle cerebellar peduncle, pons, right p thalamus/osterior limb of the internal capsule and in the periventricular, juxtacortical and deep white matter of the hemispheres.  1 focus in the left frontal lobe enhanced after contrast consistent with an acute demyelinating plaque.    Additionally, compared to the 2017 MRI there were several other new foci in the hemispheres and the focus in the cerebellum was noted.\\  MRI of the cervical spine 01/31/2018 showed multiple T2 hyperintense foci located to the left adjacent to C2-C3, centrally adjacent to C4, posterolaterally to the right adjacent to C5-C6, and centrally to the right adjacent to C7  MRI of the brain 12/12/2018 was essentially unchanged compared to the MRI from 01/31/2018 showing no definite new lesions and no enhancing foci.  MRI brain 01/02/2022 showed T2/FLAIR hyperintense foci in the cerebral hemispheres and a few foci in the pons, left middle cerebellar peduncle and left thalamus in a pattern consistent with chronic demyelinating plaque associated with multiple sclerosis.  None of the foci enhance or appear to be acute.  Compared to the MRI dated 01/31/2018,  there are no new lesions  MRI cervical spine 01/02/2022 Multiple T2 hyperintense foci within the spinal cord as detailed above consistent with chronic demyelinating plaque associated with multiple sclerosis.  None of the foci enhance.  No definite changes compared to the MRI from 01/31/2018. 2.   Minimal disc degenerative changes at C5-C6 and C6-C7 that do not lead to foraminal narrowing, spinal stenosis or nerve root compression.  REVIEW OF SYSTEMS: Constitutional: No fevers, chills, sweats, or change in appetite.  She has fatigue Eyes: No visual changes, double vision, eye pain Ear, nose and throat: No hearing loss, ear pain, nasal congestion, sore throat Cardiovascular: No  chest pain, palpitations Respiratory:  No shortness of breath at rest or with exertion.   No wheezes GastrointestinaI: No nausea, vomiting, diarrhea, abdominal pain, fecal incontinence Genitourinary: She has urinary hesitancy as well as urgency. Musculoskeletal:  No neck pain, back pain Integumentary: No rash, pruritus, skin lesions Neurological: as above Psychiatric: No depression at this time.  No anxiety Endocrine: No palpitations, diaphoresis, change in appetite, change in weigh or increased thirst Hematologic/Lymphatic:  No anemia, purpura, petechiae. Allergic/Immunologic: No itchy/runny eyes, nasal congestion, recent allergic reactions, rashes  ALLERGIES: No Known Allergies  HOME MEDICATIONS:  Current Outpatient Medications:    ALPRAZolam (XANAX) 0.5 MG tablet, Take 2-3 p.o. before MRI, Disp: 3 tablet, Rfl: 0   aspirin-acetaminophen-caffeine (EXCEDRIN MIGRAINE) 250-250-65 MG tablet, Take 1-2 tablets by mouth every 6 (six) hours as needed for headache., Disp: , Rfl:    cholecalciferol (VITAMIN D) 1000 units tablet, Take 1,000 Units by mouth daily., Disp: , Rfl:    desonide (DESOWEN) 0.05 % cream, Apply topically 2 (two) times daily., Disp: 30 g, Rfl: 0   gabapentin (NEURONTIN) 300 MG capsule, Take up to 3 po  daily, Disp: 90 capsule, Rfl: 11   levonorgestrel (MIRENA, 52 MG,) 20 MCG/DAY IUD, IUD, Disp: , Rfl:    ocrelizumab (OCREVUS) 300 MG/10ML injection, Inject into the vein every 6 (six) months., Disp: , Rfl:    phentermine 15 MG capsule, Take 1 capsule (15 mg total) by mouth every morning., Disp: 30 capsule, Rfl: 5   SUMAtriptan (IMITREX) 50 MG tablet, sumatriptan 50 mg tablet  TAKE 1 TABLET BY MOUTH ONCE DAILY AS NEEDED FOR MIGRAINE. DO NOT TAKE MORE THAN 3 DAYS PER WEEK. MAX 200MG  PER DAY., Disp: , Rfl:    triamcinolone cream (KENALOG) 0.1 %, Apply 1 application. topically 2 (two) times daily., Disp: 30 g, Rfl: 2  PAST MEDICAL HISTORY: Past Medical History:  Diagnosis Date   Migraines    Multiple sclerosis (HCC)    Neck pain    with numbness in hands   Optic neuritis    Personal history of COVID-19     PAST SURGICAL HISTORY: Past Surgical History:  Procedure Laterality Date   OVARY SURGERY     polyp excision    FAMILY HISTORY: Family History  Problem Relation Age of Onset   Diabetes Mother    Asthma Mother    Depression Mother    Alcoholism Father    Drug abuse Brother        Pain meds   Alcoholism Brother    Cirrhosis Brother     SOCIAL HISTORY:  Social History   Socioeconomic History   Marital status: Married    Spouse name: Jhana Giarratano   Number of children: 2   Years of education: Not on file   Highest education level: Not on file  Occupational History   Occupation: Homemaker  Tobacco Use   Smoking status: Former    Current packs/day: 0.50    Types: Cigarettes   Smokeless tobacco: Never  Vaping Use   Vaping status: Former  Substance and Sexual Activity   Alcohol use: No   Drug use: No   Sexual activity: Yes    Partners: Male  Other Topics Concern   Not on file  Social History Narrative   Pt is married with 2 children.  Caffeine 2 servings daily.    Social Determinants of Health   Financial Resource Strain: Low Risk  (01/23/2022)   Overall  Financial Resource Strain (CARDIA)  Difficulty of Paying Living Expenses: Not hard at all  Food Insecurity: No Food Insecurity (01/23/2022)   Hunger Vital Sign    Worried About Running Out of Food in the Last Year: Never true    Ran Out of Food in the Last Year: Never true  Transportation Needs: No Transportation Needs (01/23/2022)   PRAPARE - Administrator, Civil Service (Medical): No    Lack of Transportation (Non-Medical): No  Physical Activity: Insufficiently Active (01/23/2022)   Exercise Vital Sign    Days of Exercise per Week: 3 days    Minutes of Exercise per Session: 20 min  Stress: No Stress Concern Present (01/23/2022)   [employer] of Occupational Health - Occupational Stress Questionnaire    Feeling of Stress : Not at all  Social Connections: Moderately Integrated (01/23/2022)   Social Connection and Isolation Panel [NHANES]    Frequency of Communication with Friends and Family: More than three times a week    Frequency of Social Gatherings with Friends and Family: More than three times a week    Attends Religious Services: More than 4 times per year    Active Member of [employer] or Organizations: No    Attends Banker Meetings: Never    Marital Status: Married  Catering manager Violence: Not At Risk (01/23/2022)   Humiliation, Afraid, Rape, and Kick questionnaire    Fear of Current or Ex-Partner: No    Emotionally Abused: No    Physically Abused: No    Sexually Abused: No     PHYSICAL EXAM  Vitals:   07/02/23 1047  BP: 94/61  Pulse: 80  Weight: 134 lb (60.8 kg)  Height: 5\' 4"  (1.626 m)    Body mass index is 23 kg/m.   General: The patient is well-developed and well-nourished and in no acute distress  HEENT:  Head is [place]/AT.  Sclera are anicteric.     Skin: Extremities are without rash or  edema.  Musculoskeletal/limbs  Back is non-tender.  The left leg is slightly shorter than the left and her foot is mildly smaller  Neurologic  Exam  Mental status: The patient is alert and oriented x 3 at the time of the examination. The patient has apparent normal recent and remote memory, with an apparently normal attention span and concentration ability.   Speech is normal.  Cranial nerves: Extraocular movements are full.  Facial strength and sensation is normal.  No dysarthria.  No obvious hearing deficits are noted.  Motor:  Muscle bulk is mildly reduced in lower right leg.   Tone is increased in right leg. Strength is  5 / 5 in arms and left leg but 4/5 proximal right leg and 1/5 right ankle extension, 2/5 gastrocneiums. 0-1 toes.   Sensory: Sensory testing is intact to pinprick, soft touch and vibration sensation in all 4 extremities.  Coordination: Cerebellar testing reveals good finger-nose-finger and heel-to-shin bilaterally.  Gait and station: Station is normal.   Gait is wide with a right foot drop.  The tandem gait is poor. Romberg is negative.   Reflexes: Deep tendon reflexes are symmetric and normal bilaterally.      DIAGNOSTIC DATA (LABS, IMAGING, TESTING) - I reviewed patient records, labs, notes, testing and imaging myself where available.  Lab Results  Component Value Date   WBC 8.2 12/06/2021   HGB 13.0 12/06/2021   HCT 39.9 12/06/2021   MCV 88 12/06/2021   PLT 384 12/06/2021      Component Value  Date/Time   NA 140 12/07/2015 2037   K 3.7 12/07/2015 2037   CL 101 12/07/2015 2037   CO2 26 12/07/2015 2037   GLUCOSE 111 (H) 12/07/2015 2037   BUN 13 12/07/2015 2037   CREATININE 0.64 12/07/2015 2037   CALCIUM 9.5 12/07/2015 2037   GFRNONAA >60 12/07/2015 2037   GFRAA >60 12/07/2015 2037    Lab Results  Component Value Date   HGBA1C 5.4 12/08/2015      ASSESSMENT AND PLAN  Multiple sclerosis (HCC)  High risk medication use  Spasticity  Neurologic gait dysfunction  Urinary hesitancy  Right leg weakness   Continue Ocrevus.  Last infusion was one week ago.  Check labs.   Will  continue phentermine, for fatigue.  Weight is stable Continue gabapentin for RLS/spasms If urinary hesitancy worsens, consider tamsulosin. If panic attacks worsen, consider an SSRI or Buspar.    Return in 6 months or sooner if there are new or worsening neurologic symptoms.  Future Yeldell A. Epimenio Foot, MD, [facility] 07/02/2023, 11:22 AM Certified in Neurology, Clinical Neurophysiology, Sleep Medicine and Neuroimaging  [facility] [address] [place], [place] [address] [phone]

## 2023-07-03 LAB — CBC WITH DIFFERENTIAL/PLATELET
Basophils Absolute: 0.1 10*3/uL (ref 0.0–0.2)
Basos: 1 %
EOS (ABSOLUTE): 0.2 10*3/uL (ref 0.0–0.4)
Eos: 2 %
Hematocrit: 40 % (ref 34.0–46.6)
Hemoglobin: 12.8 g/dL (ref 11.1–15.9)
Immature Grans (Abs): 0 10*3/uL (ref 0.0–0.1)
Immature Granulocytes: 0 %
Lymphocytes Absolute: 2.2 10*3/uL (ref 0.7–3.1)
Lymphs: 33 %
MCH: 29.3 pg (ref 26.6–33.0)
MCHC: 32 g/dL (ref 31.5–35.7)
MCV: 92 fL (ref 79–97)
Monocytes Absolute: 0.5 10*3/uL (ref 0.1–0.9)
Monocytes: 8 %
Neutrophils Absolute: 3.8 10*3/uL (ref 1.4–7.0)
Neutrophils: 56 %
Platelets: 346 10*3/uL (ref 150–450)
RBC: 4.37 x10E6/uL (ref 3.77–5.28)
RDW: 12.6 % (ref 11.7–15.4)
WBC: 6.8 10*3/uL (ref 3.4–10.8)

## 2023-07-03 LAB — IGG, IGA, IGM
IgA/Immunoglobulin A, Serum: 123 mg/dL (ref 87–352)
IgG (Immunoglobin G), Serum: 924 mg/dL (ref 586–1602)
IgM (Immunoglobulin M), Srm: 44 mg/dL (ref 26–217)

## 2023-10-07 ENCOUNTER — Ambulatory Visit (INDEPENDENT_AMBULATORY_CARE_PROVIDER_SITE_OTHER): Payer: 59

## 2023-10-07 VITALS — BP 90/62 | HR 73 | Temp 97.2°F | Ht 64.0 in | Wt 135.6 lb

## 2023-10-07 DIAGNOSIS — G35 Multiple sclerosis: Secondary | ICD-10-CM | POA: Diagnosis not present

## 2023-10-07 DIAGNOSIS — R300 Dysuria: Secondary | ICD-10-CM | POA: Insufficient documentation

## 2023-10-07 DIAGNOSIS — N3001 Acute cystitis with hematuria: Secondary | ICD-10-CM | POA: Diagnosis not present

## 2023-10-07 LAB — POCT URINALYSIS DIP (CLINITEK)
Bilirubin, UA: NEGATIVE
Glucose, UA: NEGATIVE mg/dL
Ketones, POC UA: NEGATIVE mg/dL
Nitrite, UA: NEGATIVE
Spec Grav, UA: 1.015 (ref 1.010–1.025)
Urobilinogen, UA: 0.2 U/dL
pH, UA: 6.5 (ref 5.0–8.0)

## 2023-10-07 MED ORDER — NITROFURANTOIN MONOHYD MACRO 100 MG PO CAPS: 100.0000 mg | ORAL_CAPSULE | Freq: Two times a day (BID) | ORAL | 0 refills | Status: AC

## 2023-10-07 NOTE — Progress Notes (Signed)
 Acute Office Visit  Subjective:    Patient ID: Lisa Wu, female    DOB: 11/13/1980, 43 y.o.   MRN: [id number]  Chief Complaint  Patient presents with   Urinary Tract Infection    Discussed the use of AI scribe software for clinical note transcription with the patient, who gave verbal consent to proceed.      HPI: Patient is in today for UTI, patient states the following started yesterday. Patient is having burning when urinate, the urge to go urinate more.  The patient, with a history of multiple sclerosis, presents with a urinary tract infection (UTI). She reports a history of frequent UTIs, which had been well-managed for several years until the recent episode. The patient describes a sensation of incomplete bladder emptying, with frequent urges to urinate, often returning to the bathroom within five minutes.  The current UTI symptoms began at the end of November, with an initial attempt at self-management through increased water intake. However, due to the persistence of symptoms, the patient sought care at an urgent care center, where a UTI was confirmed and antibiotics were prescribed. The infection initially resolved, but the patient reports intermittent days of discomfort since then, suggesting a possible recurrence.  The patient describes the current symptoms as a burning sensation during urination, which has been severe enough to cause tearing. She also reports a sensation of urinary urgency, with minimal urine output. The patient also noted a period of feeling unwell with chills a few days prior to the onset of the current UTI symptoms.  The patient denies any back or abdominal pain, but did report a transient pinching sensation in the lower abdomen, which has since resolved. She also denies any fever. The patient has not tried any over-the-counter remedies for the current symptoms.  The patient has been managing her symptoms with increased water intake and has  been adhering to advice to fully empty her bladder each time she urinates. She has not noticed any blood in her urine, but reports that it has been darker than usual. The patient denies any new medications or changes in her health other than the UTI symptoms.    Past Medical History:  Diagnosis Date   Migraines    Multiple sclerosis (HCC)    Neck pain    with numbness in hands   Optic neuritis    Personal history of COVID-19     Past Surgical History:  Procedure Laterality Date   OVARY SURGERY     polyp excision    Family History  Problem Relation Age of Onset   Diabetes Mother    Asthma Mother    Depression Mother    Alcoholism Father    Drug abuse Brother        Pain meds   Alcoholism Brother    Cirrhosis Brother     Social History   Socioeconomic History   Marital status: Married    Spouse name: Celina Shiley   Number of children: 2   Years of education: Not on file   Highest education level: Not on file  Occupational History   Occupation: Homemaker  Tobacco Use   Smoking status: Former    Current packs/day: 0.50    Types: Cigarettes   Smokeless tobacco: Never  Vaping Use   Vaping status: Former  Substance and Sexual Activity   Alcohol use: No   Drug use: No   Sexual activity: Yes    Partners: Male  Other Topics Concern  Not on file  Social History Narrative   Pt is married with 2 children.  Caffeine  2 servings daily.    Social Drivers of Corporate Investment Banker Strain: Low Risk  (01/23/2022)   Overall Financial Resource Strain (CARDIA)    Difficulty of Paying Living Expenses: Not hard at all  Food Insecurity: No Food Insecurity (01/23/2022)   Hunger Vital Sign    Worried About Running Out of Food in the Last Year: Never true    Ran Out of Food in the Last Year: Never true  Transportation Needs: No Transportation Needs (01/23/2022)   PRAPARE - Administrator, Civil Service (Medical): No    Lack of Transportation (Non-Medical): No   Physical Activity: Insufficiently Active (01/23/2022)   Exercise Vital Sign    Days of Exercise per Week: 3 days    Minutes of Exercise per Session: 20 min  Stress: No Stress Concern Present (01/23/2022)   [employer] of Occupational Health - Occupational Stress Questionnaire    Feeling of Stress : Not at all  Social Connections: Moderately Integrated (01/23/2022)   Social Connection and Isolation Panel [NHANES]    Frequency of Communication with Friends and Family: More than three times a week    Frequency of Social Gatherings with Friends and Family: More than three times a week    Attends Religious Services: More than 4 times per year    Active Member of [employer] or Organizations: No    Attends Banker Meetings: Never    Marital Status: Married  Catering Manager Violence: Not At Risk (01/23/2022)   Humiliation, Afraid, Rape, and Kick questionnaire    Fear of Current or Ex-Partner: No    Emotionally Abused: No    Physically Abused: No    Sexually Abused: No    Outpatient Medications Prior to Visit  Medication Sig Dispense Refill   ALPRAZolam  (XANAX ) 0.5 MG tablet Take 2-3 p.o. before MRI 3 tablet 0   aspirin -acetaminophen -caffeine  (EXCEDRIN  MIGRAINE) 250-250-65 MG tablet Take 1-2 tablets by mouth every 6 (six) hours as needed for headache.     cholecalciferol (VITAMIN D ) 1000 units tablet Take 1,000 Units by mouth daily.     desonide  (DESOWEN ) 0.05 % cream Apply topically 2 (two) times daily. 30 g 0   gabapentin  (NEURONTIN ) 300 MG capsule Take up to 3 po daily 270 capsule 4   levonorgestrel  (MIRENA , 52 MG,) 20 MCG/DAY IUD IUD     ocrelizumab  (OCREVUS ) 300 MG/10ML injection Inject into the vein every 6 (six) months.     phentermine  15 MG capsule Take 1 capsule (15 mg total) by mouth every morning. 30 capsule 5   SUMAtriptan (IMITREX) 50 MG tablet sumatriptan 50 mg tablet  TAKE 1 TABLET BY MOUTH ONCE DAILY AS NEEDED FOR MIGRAINE. DO NOT TAKE MORE THAN 3 DAYS PER WEEK. MAX  200MG  PER DAY.     triamcinolone  cream (KENALOG ) 0.1 % Apply 1 application. topically 2 (two) times daily. 30 g 2   No facility-administered medications prior to visit.    No Known Allergies  Review of Systems  Constitutional:  Negative for appetite change, chills, fatigue and fever.  HENT:  Negative for congestion, ear discharge, ear pain, rhinorrhea, sinus pressure, sneezing and sore throat.   Eyes:  Negative for visual disturbance.  Respiratory:  Negative for cough, chest tightness, shortness of breath and wheezing.   Cardiovascular:  Negative for chest pain, palpitations and leg swelling.  Gastrointestinal:  Negative for abdominal pain,  diarrhea, nausea and vomiting.  Endocrine: Negative for polydipsia, polyphagia and polyuria.  Genitourinary:  Positive for difficulty urinating, frequency and urgency. Negative for dysuria, hematuria, menstrual problem, vaginal bleeding, vaginal discharge and vaginal pain.  Musculoskeletal:  Negative for back pain, gait problem, joint swelling, myalgias and neck pain.  Neurological:  Negative for dizziness, seizures, syncope, weakness, numbness and headaches.  Psychiatric/Behavioral:  Negative for agitation, confusion, hallucinations, sleep disturbance and suicidal ideas. The patient is not nervous/anxious.        Objective:        10/07/2023   10:44 AM 07/02/2023   10:47 AM 12/18/2022   11:20 AM  Vitals with BMI  Height 5' 4 5' 4 5' 4  Weight 135 lbs 10 oz 134 lbs 139 lbs  BMI 23.26 22.99 23.85  Systolic 90 94 101  Diastolic 62 61 64  Pulse 73 80 79    No data found.   Physical Exam Nursing note reviewed.  Constitutional:      Appearance: Normal appearance.  HENT:     Head: Normocephalic and atraumatic.  Cardiovascular:     Rate and Rhythm: Normal rate and regular rhythm.  Pulmonary:     Effort: Pulmonary effort is normal.     Breath sounds: Normal breath sounds.  Abdominal:     Tenderness: There is no abdominal tenderness.      Comments: No flank tenderness  No suprapubic tenderness  Musculoskeletal:        General: Normal range of motion.  Skin:    General: Skin is warm.  Neurological:     General: No focal deficit present.     Mental Status: She is alert.     There are no preventive care reminders to display for this patient.   There are no preventive care reminders to display for this patient.   No results found for: TSH Lab Results  Component Value Date   WBC 6.8 07/02/2023   HGB 12.8 07/02/2023   HCT 40.0 07/02/2023   MCV 92 07/02/2023   PLT 346 07/02/2023   Lab Results  Component Value Date   NA 140 12/07/2015   K 3.7 12/07/2015   CO2 26 12/07/2015   GLUCOSE 111 (H) 12/07/2015   BUN 13 12/07/2015   CREATININE 0.64 12/07/2015   CALCIUM 9.5 12/07/2015   ANIONGAP 13 12/07/2015   No results found for: CHOL No results found for: HDL No results found for: LDLCALC No results found for: TRIG No results found for: CHOLHDL Lab Results  Component Value Date   HGBA1C 5.4 12/08/2015       Assessment & Plan:  Acute cystitis with hematuria Assessment & Plan: Recurrent UTI with dysuria, urgency, and dark urine. Previous UTIs managed with prolonged voiding technique.   Recent UTI treated with antibiotics, but symptoms have recurred.   Urine analysis shows Increased specific gravity, 2+ blood. Protein. Negative nitrite and 3+ leukocyte esterase, suggestive of infection. Likely E. coli based on previous cultures.  Discussed risks of untreated UTI progressing to kidney infection. Explained benefits of Macrobid  (nitrofurantoin ) with fewer side effects and good tolerability. Emphasized increased fluid intake and probiotic yogurt to prevent gut flora disruption. 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 No current MS-related symptoms reported. - Continue current MS management - Monitor for new or worsening MS symptoms  General Health Maintenance Discussed hydration and dietary measures to support overall health and prevent recurrent UTIs. Advised against commercial cranberry juice due to high sugar content. - Advise drinking plenty of water - Recommend water over commercial cranberry juice  Follow-up - Follow up with primary care after urine culture results - Consider referral to urologist if recurrent UTIs persist or if interstitial cystitis is suspected.  Orders: -     POCT URINALYSIS DIP (CLINITEK) -     Urine Culture  Dysuria Assessment & Plan: As above  Orders: -     POCT URINALYSIS DIP (CLINITEK) -     Urine Culture  Other orders -     Nitrofurantoin  Monohyd Macro; Take 1 capsule (100 mg total) by mouth 2 (two) times daily for 7 days.  Dispense: 14 capsule; Refill: 0     Meds ordered this encounter  Medications   nitrofurantoin , macrocrystal-monohydrate, (MACROBID ) 100 MG capsule    Sig: Take 1 capsule (100 mg total) by mouth 2 (two) times daily for 7 days.    Dispense:  14 capsule    Refill:  0    Orders Placed This Encounter  Procedures   Urine Culture   POCT URINALYSIS DIP (CLINITEK)     Follow-up: Return if symptoms worsen or fail to improve.  An After Visit Summary was printed and given to the patient.  Kaiulani Sitton, MD [facility] [phone]

## 2023-10-07 NOTE — Assessment & Plan Note (Signed)
 As above.

## 2023-10-07 NOTE — Assessment & Plan Note (Addendum)
 Recurrent UTI with dysuria, urgency, and dark urine. Previous UTIs managed with prolonged voiding technique.   Recent UTI treated with antibiotics, but symptoms have recurred.   Urine analysis shows Increased specific gravity, 2+ blood. Protein. Negative nitrite and 3+ leukocyte esterase, suggestive of infection. Likely E. coli based on previous cultures.  Discussed risks of untreated UTI progressing to kidney infection. Explained benefits of Macrobid  (nitrofurantoin ) with fewer side effects and good tolerability. Emphasized increased fluid intake and probiotic yogurt to prevent gut flora disruption. - Send urine for culture - Prescribe Macrobid  (nitrofurantoin ) 100 mg, twice daily for 7 days - Advise increased fluid intake - Recommend probiotic yogurt - Monitor for signs of kidney infection (back pain, fever, chills) - Follow up on urine culture results  Multiple Sclerosis (MS) MS contributing to recurrent UTIs due to neurogenic bladder. No current MS-related symptoms reported. - Continue current MS management - Monitor for new or worsening MS symptoms  General Health Maintenance Discussed hydration and dietary measures to support overall health and prevent recurrent UTIs. Advised against commercial cranberry juice due to high sugar content. - Advise drinking plenty of water - Recommend water over commercial cranberry juice  Follow-up - Follow up with primary care after urine culture results - Consider referral to urologist if recurrent UTIs persist or if interstitial cystitis is suspected.

## 2023-10-07 NOTE — Patient Instructions (Signed)
 Appears to be UTI Sent antibiotic Stay hydrated Report back if any worsening symptoms or call 911 for any severe symptoms

## 2023-10-10 LAB — URINE CULTURE

## 2023-10-17 MED ORDER — CEPHALEXIN 500 MG PO CAPS
500.0000 mg | ORAL_CAPSULE | Freq: Two times a day (BID) | ORAL | 0 refills | Status: AC
Start: 2023-10-17 — End: 2023-10-24

## 2023-11-06 ENCOUNTER — Ambulatory Visit (INDEPENDENT_AMBULATORY_CARE_PROVIDER_SITE_OTHER): Payer: 59

## 2023-11-06 VITALS — BP 102/78 | HR 65 | Temp 98.7°F | Ht 64.0 in | Wt 136.6 lb

## 2023-11-06 DIAGNOSIS — R300 Dysuria: Secondary | ICD-10-CM | POA: Diagnosis not present

## 2023-11-06 DIAGNOSIS — N390 Urinary tract infection, site not specified: Secondary | ICD-10-CM | POA: Diagnosis not present

## 2023-11-06 LAB — POCT URINALYSIS DIP (CLINITEK)
Blood, UA: NEGATIVE
Glucose, UA: NEGATIVE mg/dL
Ketones, POC UA: NEGATIVE mg/dL
Nitrite, UA: POSITIVE — AB
POC PROTEIN,UA: NEGATIVE
Spec Grav, UA: 1.01 (ref 1.010–1.025)
Urobilinogen, UA: 1 U/dL
pH, UA: 6.5 (ref 5.0–8.0)

## 2023-11-06 MED ORDER — CEPHALEXIN 500 MG PO CAPS: 500.0000 mg | ORAL_CAPSULE | Freq: Three times a day (TID) | ORAL | 0 refills | Status: AC

## 2023-11-06 NOTE — Assessment & Plan Note (Addendum)
UA showed 2+Bilirubin, increased specific gravity, 2+ urobilinogen, Positive nitrite, 3+ Leukocyte esterase.   Three UTIs in the past three months (November, January, February). Symptoms began mildly on February 10th, worsened on February 12th. Urinalysis: positive bilirubin, increased specific gravity, nitrite, leukocyte esterase. Previous cultures: E. coli susceptible to ciprofloxacin and ceftriaxone. Discussed urology referral for further evaluation, including cystoscopy. Prefers [place] for referral due to panic attacks when traveling to [place]. - Refer to urology for evaluation and possible cystoscopy - Prescribe KEFLEX 500 mg three times daily for seven days - Send urine for culture - Advise probiotics or probiotic yogurt to prevent yeast infections - Instruct to call if yeast infection develops - Follow up once culture results are available to confirm appropriate antibiotic  Panic Attacks Experiences panic attacks, especially in cars, with symptoms like chest pain, dyspnea, and upper abdominal pain. Causes significant distress and activity avoidance. - Advise to avoid triggers and manage stress - Recommend mental health support if symptoms persist or worsen  General Health Maintenance Concerns about flu exposure due to recent cases in community and church. Experiencing intermittent chills and nasal symptoms. - Advise to wear a mask and rest if symptoms develop - Instruct to go to the emergency room if symptoms worsen or if experiencing back pain or signs of severe infection  Follow-up - Follow up with urology referral - Follow up with primary care once culture results are available.

## 2023-11-06 NOTE — Progress Notes (Signed)
Acute Office Visit  Subjective:    Patient ID: Lisa Wu, female    DOB: 1980/11/26, 43 y.o.   MRN: [id number]  Chief Complaint  Patient presents with   Urinary Tract Infection    Discussed the use of AI scribe software for clinical note transcription with the patient, who gave verbal consent to proceed.      HPI: Lisa Wu is a 43 year old female with recurrent urinary tract infections who presents with urinary symptoms.  She has been experiencing recurrent urinary tract infections (UTIs) with episodes in November 2024, January 2025, and February 2025. The initial episode in January was treated with Macrobid, but the culture showed E. coli, and symptoms recurred. A stronger antibiotic was prescribed, which provided longer relief, but symptoms returned again. Her current symptoms began mildly on November 03, 2023, eased off, and then returned more intensely on November 05, 2023. Urinalysis shows positive bilirubin, increased specific gravity, nitrite, and leukocyte esterase 3+. No blood in the urine. She has a history of frequent UTIs, especially during pregnancy, and describes a urinary pattern where the stream cuts off abruptly, which she manages by sitting longer to ensure complete emptying.  She has not started any new medications and reports drinking only water and a morning latte since November 2024, having previously consumed Diet Dr. Reino Kent. She denies any changes in her sexual activity or menstrual cycle, as she has an IUD and does not menstruate.  She experiences panic attacks, particularly in cars, which have led to symptoms such as waking up at night feeling like she is having a heart attack, with pain at the top of her stomach. These episodes are painful and associated with difficulty breathing.  She has been experiencing off and on chills and nasal symptoms, possibly related to flu exposure from her husband and church community, where several  members have been diagnosed with flu A. No abdominal pain, upper or lower, and no recent yeast infections, though there is a history of them following antibiotic use.  Past Medical History:  Diagnosis Date   Migraines    Multiple sclerosis (HCC)    Neck pain    with numbness in hands   Optic neuritis    Personal history of COVID-19     Past Surgical History:  Procedure Laterality Date   OVARY SURGERY     polyp excision    Family History  Problem Relation Age of Onset   Diabetes Mother    Asthma Mother    Depression Mother    Alcoholism Father    Drug abuse Brother        Pain meds   Alcoholism Brother    Cirrhosis Brother     Social History   Socioeconomic History   Marital status: Married    Spouse name: Kanoe Wanner   Number of children: 2   Years of education: Not on file   Highest education level: Not on file  Occupational History   Occupation: Homemaker  Tobacco Use   Smoking status: Former    Current packs/day: 0.50    Types: Cigarettes   Smokeless tobacco: Never  Vaping Use   Vaping status: Former  Substance and Sexual Activity   Alcohol use: No   Drug use: No   Sexual activity: Yes    Partners: Male  Other Topics Concern   Not on file  Social History Narrative   Pt is married with 2 children.  Caffeine 2 servings daily.  Social Drivers of Corporate investment banker Strain: Low Risk  (01/23/2022)   Overall Financial Resource Strain (CARDIA)    Difficulty of Paying Living Expenses: Not hard at all  Food Insecurity: No Food Insecurity (01/23/2022)   Hunger Vital Sign    Worried About Running Out of Food in the Last Year: Never true    Ran Out of Food in the Last Year: Never true  Transportation Needs: No Transportation Needs (01/23/2022)   PRAPARE - Administrator, Civil Service (Medical): No    Lack of Transportation (Non-Medical): No  Physical Activity: Insufficiently Active (01/23/2022)   Exercise Vital Sign    Days of Exercise per  Week: 3 days    Minutes of Exercise per Session: 20 min  Stress: No Stress Concern Present (01/23/2022)   [employer] of Occupational Health - Occupational Stress Questionnaire    Feeling of Stress : Not at all  Social Connections: Moderately Integrated (01/23/2022)   Social Connection and Isolation Panel [NHANES]    Frequency of Communication with Friends and Family: More than three times a week    Frequency of Social Gatherings with Friends and Family: More than three times a week    Attends Religious Services: More than 4 times per year    Active Member of [employer] or Organizations: No    Attends Banker Meetings: Never    Marital Status: Married  Catering manager Violence: Not At Risk (01/23/2022)   Humiliation, Afraid, Rape, and Kick questionnaire    Fear of Current or Ex-Partner: No    Emotionally Abused: No    Physically Abused: No    Sexually Abused: No    Outpatient Medications Prior to Visit  Medication Sig Dispense Refill   aspirin-acetaminophen-caffeine (EXCEDRIN MIGRAINE) 250-250-65 MG tablet Take 1-2 tablets by mouth every 6 (six) hours as needed for headache.     cholecalciferol (VITAMIN D) 1000 units tablet Take 1,000 Units by mouth daily.     desonide (DESOWEN) 0.05 % cream Apply topically 2 (two) times daily. 30 g 0   gabapentin (NEURONTIN) 300 MG capsule Take up to 3 po daily 270 capsule 4   levonorgestrel (MIRENA, 52 MG,) 20 MCG/DAY IUD IUD     ocrelizumab (OCREVUS) 300 MG/10ML injection Inject into the vein every 6 (six) months.     phentermine 15 MG capsule Take 1 capsule (15 mg total) by mouth every morning. 30 capsule 5   SUMAtriptan (IMITREX) 50 MG tablet sumatriptan 50 mg tablet  TAKE 1 TABLET BY MOUTH ONCE DAILY AS NEEDED FOR MIGRAINE. DO NOT TAKE MORE THAN 3 DAYS PER WEEK. MAX 200MG  PER DAY.     triamcinolone cream (KENALOG) 0.1 % Apply 1 application. topically 2 (two) times daily. 30 g 2   ALPRAZolam (XANAX) 0.5 MG tablet Take 2-3 p.o. before  MRI (Patient not taking: Reported on 11/06/2023) 3 tablet 0   No facility-administered medications prior to visit.    No Known Allergies  Review of Systems  Constitutional:  Negative for chills, fatigue and fever.  HENT:  Negative for congestion, ear pain, sinus pain and sore throat.   Respiratory:  Negative for cough and shortness of breath.   Cardiovascular:  Negative for chest pain.  Gastrointestinal:  Negative for abdominal pain, constipation, diarrhea, nausea and vomiting.  Genitourinary:  Positive for difficulty urinating, urgency and vaginal pain. Negative for dysuria and frequency.  Musculoskeletal:  Negative for arthralgias and myalgias.  Neurological:  Negative for dizziness and headaches.  Psychiatric/Behavioral:  Negative for dysphoric mood. The patient is not nervous/anxious.        Objective:        11/06/2023    3:33 PM 10/07/2023   10:44 AM 07/02/2023   10:47 AM  Vitals with BMI  Height 5\' 4"  5\' 4"  5\' 4"   Weight 136 lbs 10 oz 135 lbs 10 oz 134 lbs  BMI 23.44 23.26 22.99  Systolic 102 90 94  Diastolic 78 62 61  Pulse 65 73 80    No data found.   Physical Exam Vitals and nursing note reviewed.  HENT:     Head: Normocephalic and atraumatic.  Cardiovascular:     Rate and Rhythm: Normal rate and regular rhythm.  Pulmonary:     Effort: Pulmonary effort is normal.     Breath sounds: Normal breath sounds.  Abdominal:     General: There is no distension.     Tenderness: There is abdominal tenderness (MILD SUPRAPUBLIC TENDERNESS). There is no right CVA tenderness or left CVA tenderness.  Neurological:     General: No focal deficit present.     Mental Status: She is alert.  Psychiatric:        Mood and Affect: Mood normal.     There are no preventive care reminders to display for this patient.  There are no preventive care reminders to display for this patient.   No results found for: "TSH" Lab Results  Component Value Date   WBC 6.8 07/02/2023    HGB 12.8 07/02/2023   HCT 40.0 07/02/2023   MCV 92 07/02/2023   PLT 346 07/02/2023   Lab Results  Component Value Date   NA 140 12/07/2015   K 3.7 12/07/2015   CO2 26 12/07/2015   GLUCOSE 111 (H) 12/07/2015   BUN 13 12/07/2015   CREATININE 0.64 12/07/2015   CALCIUM 9.5 12/07/2015   ANIONGAP 13 12/07/2015   No results found for: "CHOL" No results found for: "HDL" No results found for: "LDLCALC" No results found for: "TRIG" No results found for: "CHOLHDL" Lab Results  Component Value Date   HGBA1C 5.4 12/08/2015       Assessment & Plan:  Dysuria Assessment & Plan: UA showed 2+Bilirubin, increased specific gravity, 2+ urobilinogen, Positive nitrite, 3+ Leukocyte esterase.   Three UTIs in the past three months (November, January, February). Symptoms began mildly on February 10th, worsened on February 12th. Urinalysis: positive bilirubin, increased specific gravity, nitrite, leukocyte esterase. Previous cultures: E. coli susceptible to ciprofloxacin and ceftriaxone. Discussed urology referral for further evaluation, including cystoscopy. Prefers [place] for referral due to panic attacks when traveling to [place]. - Refer to urology for evaluation and possible cystoscopy - Prescribe KEFLEX 500 mg three times daily for seven days - Send urine for culture - Advise probiotics or probiotic yogurt to prevent yeast infections - Instruct to call if yeast infection develops - Follow up once culture results are available to confirm appropriate antibiotic  Panic Attacks Experiences panic attacks, especially in cars, with symptoms like chest pain, dyspnea, and upper abdominal pain. Causes significant distress and activity avoidance. - Advise to avoid triggers and manage stress - Recommend mental health support if symptoms persist or worsen  General Health Maintenance Concerns about flu exposure due to recent cases in community and church. Experiencing intermittent chills and nasal  symptoms. - Advise to wear a mask and rest if symptoms develop - Instruct to go to the emergency room if symptoms worsen or if experiencing back pain  or signs of severe infection  Follow-up - Follow up with urology referral - Follow up with primary care once culture results are available.  Orders: -     POCT URINALYSIS DIP (CLINITEK) -     Urine Culture -     Ambulatory referral to Urology  Recurrent UTI Assessment & Plan: Referral placed to urology for evaluation Recommend daily probiotic use  Orders: -     Ambulatory referral to Urology  Other orders -     Cephalexin; Take 1 capsule (500 mg total) by mouth 3 (three) times daily for 7 days.  Dispense: 21 capsule; Refill: 0     Meds ordered this encounter  Medications   cephALEXin (KEFLEX) 500 MG capsule    Sig: Take 1 capsule (500 mg total) by mouth 3 (three) times daily for 7 days.    Dispense:  21 capsule    Refill:  0    Orders Placed This Encounter  Procedures   Urine Culture   Ambulatory referral to Urology   POCT URINALYSIS DIP (CLINITEK)     Follow-up: Return if symptoms worsen or fail to improve.  An After Visit Summary was printed and given to the patient.  Windell Moment, MD [facility] [phone]

## 2023-11-06 NOTE — Assessment & Plan Note (Signed)
Referral placed to urology for evaluation Recommend daily probiotic use

## 2023-11-06 NOTE — Patient Instructions (Signed)
VISIT SUMMARY:  During today's visit, we discussed your recurrent urinary tract infections (UTIs), panic attacks, and general health maintenance. We reviewed your symptoms, performed a urinalysis, and discussed a plan for further evaluation and treatment.  YOUR PLAN:  -RECURRENT URINARY TRACT INFECTIONS (UTIS): Recurrent UTIs are repeated infections of the urinary tract, often caused by bacteria like E. coli. We will refer you to a urologist for further evaluation, including a possible cystoscopy. You are prescribed ciprofloxacin 500 mg three times daily for seven days. We will send your urine for culture to confirm the appropriate antibiotic. To prevent yeast infections, consider taking probiotics or eating probiotic yogurt. Please call us if you develop a yeast infection.  -PANIC ATTACKS: Panic attacks are sudden episodes of intense fear that can cause physical symptoms like chest pain and difficulty breathing. To manage these, try to avoid known triggers and manage stress. If your symptoms persist or worsen, we recommend seeking mental health support.  -GENERAL HEALTH MAINTENANCE: Given the recent flu cases in your community, it's important to take precautions. Wear a mask and rest if you develop symptoms. If your symptoms worsen or you experience back pain or signs of severe infection, go to the emergency room.  INSTRUCTIONS:  Please follow up with the urology referral and with our office once your urine culture results are available.

## 2023-11-09 LAB — URINE CULTURE

## 2023-12-17 ENCOUNTER — Encounter: Payer: Self-pay | Admitting: Neurology

## 2023-12-17 DIAGNOSIS — E663 Overweight: Secondary | ICD-10-CM

## 2023-12-17 MED ORDER — PHENTERMINE HCL 15 MG PO CAPS: 15.0000 mg | ORAL_CAPSULE | ORAL | 5 refills | Status: DC

## 2023-12-17 NOTE — Telephone Encounter (Signed)
 Last seen 07/02/23 and next f/u 01/20/24. Last refilled phentermine 11/19/23 #30.

## 2023-12-23 DIAGNOSIS — G35 Multiple sclerosis: Secondary | ICD-10-CM | POA: Diagnosis not present

## 2024-01-20 ENCOUNTER — Telehealth: Payer: Self-pay | Admitting: Adult Health

## 2024-01-20 ENCOUNTER — Ambulatory Visit: Payer: 59 | Admitting: Adult Health

## 2024-01-20 NOTE — Telephone Encounter (Signed)
 LVM and sent mychart msg informing pt of need to reschedule 01/20/24 appt - NP out

## 2024-01-28 ENCOUNTER — Encounter: Payer: Self-pay | Admitting: Adult Health

## 2024-01-28 ENCOUNTER — Telehealth: Payer: Self-pay | Admitting: Neurology

## 2024-01-28 ENCOUNTER — Ambulatory Visit: Admitting: Adult Health

## 2024-01-28 NOTE — Progress Notes (Deleted)
 [facility]  PATIENT: Lisa Wu DOB: 03-20-1981  REFERRING DOCTOR OR PCP: Dr. Donivan Furry ([facility] neurology) SOURCE: Patient, notes from [facility], imaging and lab reports, MRI images personally reviewed.  _________________________________   HISTORICAL  CHIEF COMPLAINT:  No chief complaint on file.   HISTORY OF PRESENT ILLNESS:  Lisa Wu is a 43 y.o.o woman with relapsing multiple sclerosis  Update 01/28/2024: Returns for follow-up visit, last visit with Dr. Godwin Lat 07/01/2023.  She is Ocrevus  and tolerates it well.  Last infusion was ***.   Labs 06/2023 showed normal IgG and IgM.   She denies exacerbation or new symptoms.  12/2021 MRIs showed no new lesions.    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   She has fallen a few times when she turns fast.    She needs to hold the bannister.  She can walk a mile but does not keep up with others well.  She reports more right leg spasticity    She once had a right AFO and we had discussed getting another one in the past.  She often gets ankle pain due to the right leg inverting.    She often needs to grab a wall or furniture while walking but does not use a cane or walker.     She has a Lhermitte sign chronically and some dysesthesias in limbs.    Gabapentin  600 mg po qHS has helped at night and the daytime sensations are not too annoying.   Gabapentin  also help right leg jerking at night.     She has urinary hesitancy despite also having urgency.   She often needs to go multiple times in a row - less now thant she tries to sit longer.   She experienced recurrent UTIs towards the end of last year and beginning of this year.  PCP referred her to urology.  We discussed tamsulosin if this worsens.       Her vision is ok occasional diplopia when she turns her head.  Her left eye seems to have more blurriness but good color vision.    Fluorescent lights annoy her  She has fatigue associated with her MS.   Sleep is non-restorative many nights.   She snores but has not been told she has gasps, snorts or other OSA signs.   She is both sleepy and tired during the day.   Leg movements sometimes makes sleep harder.    She takes a nap every Sunday but not other days.    Phentermine  has helped her fatigue and somnolence as well as weight loss.  Ritalin  had helped but she had trouble getting at pharmacy.    As on birth control,there would be an interaction with Provigil  She has panic attacks, often triggered by driving.  She has had 2 bad episodes lasting a few hours once and 10 minutes for another one.  She went to the ED and her heart was fine   MS history: In 2000, she had the onset of a Lhermitte sign radiating to the arms and into her back.  This was evaluated by neurology with an MRI and a lumbar puncture and she was diagnosed with multiple sclerosis.  She was started on Betaseron.  She stopped in 2006 for a pregnancy.  She had noted some in the ejection site reactions and flulike reactions.  In 2005 she had a relapse with double  vision that was treated with IV steroids.  After the pregnancy she started Rebif but had significant flulike symptoms and injection site reactions and stopped all disease modifying therapies in 2007.  Clinically she did fairly well with intermittent transient symptoms.  In December 2014 she developed right arm numbness and right hand up clumsiness.  She saw [facility] neurology and was treated with a 3-day course of IV Solu-Medrol  with improvement that was incomplete.  Around that time a neurogenic bladder was also noted with hesitancy as well as frequency/urgency.  She had relapses in June 2016 and March 2017 but was not on therapy at the time.  She had a relapse with reduced sensation in both hands and clumsiness.   She had only partial benefit She had an active MRI of the brain May 2019 and she was initiated  on Ocrevus .  MRI of the brain in March 2020 was stable.   She has no new symptoms since starting Ocrevus  and tolerates it very well.     Her last infusion was August 2022 and she was supposed to get one in February but has not had it yet.     She has never been able to move her toes on the right foot.  She has severe muscle atrophy in the lower right leg.       MRI images personally reviewed: MRI of the brain 12/07/2015 showed multiple T2/STIR hyperintense foci in the periventricular, juxtacortical and deep white matter of the hemispheres.  Foci are also noted in the pons and in the right thalamus/internal capsule.  1 focus in the left parieto-occipital lobe enhanced after contrast.  MRI of the brain 01/31/2018 showed T2/FLAIR hyperintense foci in the left cerebellar hemisphere, left middle cerebellar peduncle, pons, right p thalamus/osterior limb of the internal capsule and in the periventricular, juxtacortical and deep white matter of the hemispheres.  1 focus in the left frontal lobe enhanced after contrast consistent with an acute demyelinating plaque.    Additionally, compared to the 2017 MRI there were several other new foci in the hemispheres and the focus in the cerebellum was noted.\\  MRI of the cervical spine 01/31/2018 showed multiple T2 hyperintense foci located to the left adjacent to C2-C3, centrally adjacent to C4, posterolaterally to the right adjacent to C5-C6, and centrally to the right adjacent to C7  MRI of the brain 12/12/2018 was essentially unchanged compared to the MRI from 01/31/2018 showing no definite new lesions and no enhancing foci.  MRI brain 01/02/2022 showed T2/FLAIR hyperintense foci in the cerebral hemispheres and a few foci in the pons, left middle cerebellar peduncle and left thalamus in a pattern consistent with chronic demyelinating plaque associated with multiple sclerosis.  None of the foci enhance or appear to be acute.  Compared to the MRI dated 01/31/2018, there are  no new lesions  MRI cervical spine 01/02/2022 Multiple T2 hyperintense foci within the spinal cord as detailed above consistent with chronic demyelinating plaque associated with multiple sclerosis.  None of the foci enhance.  No definite changes compared to the MRI from 01/31/2018. 2.   Minimal disc degenerative changes at C5-C6 and C6-C7 that do not lead to foraminal narrowing, spinal stenosis or nerve root compression.  REVIEW OF SYSTEMS: Constitutional: No fevers, chills, sweats, or change in appetite.  She has fatigue Eyes: No visual changes, double vision, eye pain Ear, nose and throat: No hearing loss, ear pain, nasal congestion, sore throat Cardiovascular: No chest pain, palpitations Respiratory:  No shortness  of breath at rest or with exertion.   No wheezes GastrointestinaI: No nausea, vomiting, diarrhea, abdominal pain, fecal incontinence Genitourinary: She has urinary hesitancy as well as urgency. Musculoskeletal:  No neck pain, back pain Integumentary: No rash, pruritus, skin lesions Neurological: as above Psychiatric: No depression at this time.  No anxiety Endocrine: No palpitations, diaphoresis, change in appetite, change in weigh or increased thirst Hematologic/Lymphatic:  No anemia, purpura, petechiae. Allergic/Immunologic: No itchy/runny eyes, nasal congestion, recent allergic reactions, rashes  ALLERGIES: No Known Allergies  HOME MEDICATIONS:  Current Outpatient Medications:    ALPRAZolam  (XANAX ) 0.5 MG tablet, Take 2-3 p.o. before MRI (Patient not taking: Reported on 11/06/2023), Disp: 3 tablet, Rfl: 0   aspirin -acetaminophen -caffeine  (EXCEDRIN  MIGRAINE) 250-250-65 MG tablet, Take 1-2 tablets by mouth every 6 (six) hours as needed for headache., Disp: , Rfl:    cholecalciferol (VITAMIN D ) 1000 units tablet, Take 1,000 Units by mouth daily., Disp: , Rfl:    desonide  (DESOWEN ) 0.05 % cream, Apply topically 2 (two) times daily., Disp: 30 g, Rfl: 0   gabapentin  (NEURONTIN )  300 MG capsule, Take up to 3 po daily, Disp: 270 capsule, Rfl: 4   levonorgestrel  (MIRENA , 52 MG,) 20 MCG/DAY IUD, IUD, Disp: , Rfl:    ocrelizumab  (OCREVUS ) 300 MG/10ML injection, Inject into the vein every 6 (six) months., Disp: , Rfl:    phentermine  15 MG capsule, Take 1 capsule (15 mg total) by mouth every morning., Disp: 30 capsule, Rfl: 5   SUMAtriptan (IMITREX) 50 MG tablet, sumatriptan 50 mg tablet  TAKE 1 TABLET BY MOUTH ONCE DAILY AS NEEDED FOR MIGRAINE. DO NOT TAKE MORE THAN 3 DAYS PER WEEK. MAX 200MG  PER DAY., Disp: , Rfl:    triamcinolone  cream (KENALOG ) 0.1 %, Apply 1 application. topically 2 (two) times daily., Disp: 30 g, Rfl: 2  PAST MEDICAL HISTORY: Past Medical History:  Diagnosis Date   Migraines    Multiple sclerosis (HCC)    Neck pain    with numbness in hands   Optic neuritis    Personal history of COVID-19     PAST SURGICAL HISTORY: Past Surgical History:  Procedure Laterality Date   OVARY SURGERY     polyp excision    FAMILY HISTORY: Family History  Problem Relation Age of Onset   Diabetes Mother    Asthma Mother    Depression Mother    Alcoholism Father    Drug abuse Brother        Pain meds   Alcoholism Brother    Cirrhosis Brother     SOCIAL HISTORY:  Social History   Socioeconomic History   Marital status: Married    Spouse name: Brit Boyan   Number of children: 2   Years of education: Not on file   Highest education level: Not on file  Occupational History   Occupation: Homemaker  Tobacco Use   Smoking status: Former    Current packs/day: 0.50    Types: Cigarettes   Smokeless tobacco: Never  Vaping Use   Vaping status: Former  Substance and Sexual Activity   Alcohol use: No   Drug use: No   Sexual activity: Yes    Partners: Male  Other Topics Concern   Not on file  Social History Narrative   Pt is married with 2 children.  Caffeine  2 servings daily.    Social Drivers of Health   Financial Resource Strain: Low Risk   (01/23/2022)   Overall Financial Resource Strain (CARDIA)    Difficulty  of Paying Living Expenses: Not hard at all  Food Insecurity: No Food Insecurity (01/23/2022)   Hunger Vital Sign    Worried About Running Out of Food in the Last Year: Never true    Ran Out of Food in the Last Year: Never true  Transportation Needs: No Transportation Needs (01/23/2022)   PRAPARE - Administrator, Civil Service (Medical): No    Lack of Transportation (Non-Medical): No  Physical Activity: Insufficiently Active (01/23/2022)   Exercise Vital Sign    Days of Exercise per Week: 3 days    Minutes of Exercise per Session: 20 min  Stress: No Stress Concern Present (01/23/2022)   [employer] of Occupational Health - Occupational Stress Questionnaire    Feeling of Stress : Not at all  Social Connections: Moderately Integrated (01/23/2022)   Social Connection and Isolation Panel [NHANES]    Frequency of Communication with Friends and Family: More than three times a week    Frequency of Social Gatherings with Friends and Family: More than three times a week    Attends Religious Services: More than 4 times per year    Active Member of [employer] or Organizations: No    Attends Banker Meetings: Never    Marital Status: Married  Catering manager Violence: Not At Risk (01/23/2022)   Humiliation, Afraid, Rape, and Kick questionnaire    Fear of Current or Ex-Partner: No    Emotionally Abused: No    Physically Abused: No    Sexually Abused: No     PHYSICAL EXAM  There were no vitals filed for this visit.   There is no height or weight on file to calculate BMI.   General: The patient is well-developed and well-nourished and in no acute distress  HEENT:  Head is [place]/AT.  Sclera are anicteric.     Skin: Extremities are without rash or  edema.  Musculoskeletal/limbs  Back is non-tender.  The left leg is slightly shorter than the left and her foot is mildly smaller  Neurologic Exam  Mental  status: The patient is alert and oriented x 3 at the time of the examination. The patient has apparent normal recent and remote memory, with an apparently normal attention span and concentration ability.   Speech is normal.  Cranial nerves: Extraocular movements are full.  Facial strength and sensation is normal.  No dysarthria.  No obvious hearing deficits are noted.  Motor:  Muscle bulk is mildly reduced in lower right leg.   Tone is increased in right leg. Strength is  5 / 5 in arms and left leg but 4/5 proximal right leg and 1/5 right ankle extension, 2/5 gastrocneiums. 0-1 toes.   Sensory: Sensory testing is intact to pinprick, soft touch and vibration sensation in all 4 extremities.  Coordination: Cerebellar testing reveals good finger-nose-finger and heel-to-shin bilaterally.  Gait and station: Station is normal.   Gait is wide with a right foot drop.  The tandem gait is poor. Romberg is negative.   Reflexes: Deep tendon reflexes are symmetric and normal bilaterally.      DIAGNOSTIC DATA (LABS, IMAGING, TESTING) - I reviewed patient records, labs, notes, testing and imaging myself where available.  Lab Results  Component Value Date   WBC 6.8 07/02/2023   HGB 12.8 07/02/2023   HCT 40.0 07/02/2023   MCV 92 07/02/2023   PLT 346 07/02/2023      Component Value Date/Time   NA 140 12/07/2015 2037   K 3.7 12/07/2015  2037   CL 101 12/07/2015 2037   CO2 26 12/07/2015 2037   GLUCOSE 111 (H) 12/07/2015 2037   BUN 13 12/07/2015 2037   CREATININE 0.64 12/07/2015 2037   CALCIUM 9.5 12/07/2015 2037   GFRNONAA >60 12/07/2015 2037   GFRAA >60 12/07/2015 2037    Lab Results  Component Value Date   HGBA1C 5.4 12/08/2015      ASSESSMENT AND PLAN  No diagnosis found.   Continue Ocrevus .  Last infusion was one week ago.  Check labs.   Will continue phentermine , for fatigue.  Weight is stable Continue gabapentin  for RLS/spasms If urinary hesitancy worsens, consider  tamsulosin. If panic attacks worsen, consider an SSRI or Buspar.    Return in 6 months or sooner if there are new or worsening neurologic symptoms.     I spent *** minutes of face-to-face and non-face-to-face time with patient.  This included previsit chart review, lab review, study review, order entry, electronic health record documentation, patient education and discussion regarding above diagnoses and treatment plan and answered all other questions to patient's satisfaction  Johny Nap, [place]  [facility] [address] [place], [place] [address]  Phone [phone] Fax [phone] Note: This document was prepared with digital dictation and possible smart phrase technology. Any transcriptional errors that result from this process are unintentional.

## 2024-01-28 NOTE — Telephone Encounter (Signed)
 Pt has called to Inquire about r/s appointment, call sent to billing dept to address No Show fee

## 2024-01-29 ENCOUNTER — Encounter: Payer: Self-pay | Admitting: Adult Health

## 2024-01-29 ENCOUNTER — Ambulatory Visit: Admitting: Adult Health

## 2024-01-29 VITALS — BP 95/61 | HR 72 | Ht 64.0 in | Wt 133.0 lb

## 2024-01-29 DIAGNOSIS — E663 Overweight: Secondary | ICD-10-CM | POA: Diagnosis not present

## 2024-01-29 DIAGNOSIS — R3911 Hesitancy of micturition: Secondary | ICD-10-CM | POA: Diagnosis not present

## 2024-01-29 DIAGNOSIS — F419 Anxiety disorder, unspecified: Secondary | ICD-10-CM | POA: Diagnosis not present

## 2024-01-29 DIAGNOSIS — G35 Multiple sclerosis: Secondary | ICD-10-CM | POA: Diagnosis not present

## 2024-01-29 DIAGNOSIS — Z79899 Other long term (current) drug therapy: Secondary | ICD-10-CM

## 2024-01-29 DIAGNOSIS — R252 Cramp and spasm: Secondary | ICD-10-CM

## 2024-01-29 DIAGNOSIS — Z0289 Encounter for other administrative examinations: Secondary | ICD-10-CM

## 2024-01-29 DIAGNOSIS — R269 Unspecified abnormalities of gait and mobility: Secondary | ICD-10-CM

## 2024-01-29 DIAGNOSIS — G35D Multiple sclerosis, unspecified: Secondary | ICD-10-CM

## 2024-01-29 MED ORDER — ALPRAZOLAM 0.25 MG PO TABS: 0.2500 mg | ORAL_TABLET | Freq: Every day | ORAL | 0 refills | Status: AC | PRN
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
## 2024-01-30 LAB — CBC WITH DIFFERENTIAL/PLATELET
Basophils Absolute: 0.1 10*3/uL (ref 0.0–0.2)
Basos: 1 %
EOS (ABSOLUTE): 0.3 10*3/uL (ref 0.0–0.4)
Eos: 3 %
Hematocrit: 40.2 % (ref 34.0–46.6)
Hemoglobin: 13 g/dL (ref 11.1–15.9)
Immature Grans (Abs): 0 10*3/uL (ref 0.0–0.1)
Immature Granulocytes: 0 %
Lymphocytes Absolute: 2.5 10*3/uL (ref 0.7–3.1)
Lymphs: 32 %
MCH: 28.3 pg (ref 26.6–33.0)
MCHC: 32.3 g/dL (ref 31.5–35.7)
MCV: 88 fL (ref 79–97)
Monocytes Absolute: 0.5 10*3/uL (ref 0.1–0.9)
Monocytes: 6 %
Neutrophils Absolute: 4.4 10*3/uL (ref 1.4–7.0)
Neutrophils: 58 %
Platelets: 348 10*3/uL (ref 150–450)
RBC: 4.59 x10E6/uL (ref 3.77–5.28)
RDW: 12.7 % (ref 11.7–15.4)
WBC: 7.7 10*3/uL (ref 3.4–10.8)

## 2024-01-30 LAB — IGG, IGA, IGM
IgA/Immunoglobulin A, Serum: 128 mg/dL (ref 87–352)
IgG (Immunoglobin G), Serum: 951 mg/dL (ref 586–1602)
IgM (Immunoglobulin M), Srm: 47 mg/dL (ref 26–217)

## 2024-02-02 ENCOUNTER — Encounter: Payer: Self-pay | Admitting: Adult Health

## 2024-02-23 ENCOUNTER — Ambulatory Visit (INDEPENDENT_AMBULATORY_CARE_PROVIDER_SITE_OTHER)

## 2024-02-23 VITALS — BP 114/68 | HR 81 | Temp 97.5°F | Ht 64.0 in | Wt 133.4 lb

## 2024-02-23 DIAGNOSIS — R300 Dysuria: Secondary | ICD-10-CM

## 2024-02-23 DIAGNOSIS — G35 Multiple sclerosis: Secondary | ICD-10-CM

## 2024-02-23 LAB — POCT URINALYSIS DIP (CLINITEK)
Bilirubin, UA: NEGATIVE
Glucose, UA: NEGATIVE mg/dL
Ketones, POC UA: NEGATIVE mg/dL
Nitrite, UA: NEGATIVE
POC PROTEIN,UA: NEGATIVE
Spec Grav, UA: 1.01 (ref 1.010–1.025)
Urobilinogen, UA: 0.2 U/dL
pH, UA: 6.5 (ref 5.0–8.0)

## 2024-02-23 MED ORDER — CEPHALEXIN 500 MG PO CAPS: 500.0000 mg | ORAL_CAPSULE | Freq: Three times a day (TID) | ORAL | 0 refills | Status: AC

## 2024-02-23 NOTE — Patient Instructions (Signed)
  VISIT SUMMARY: Today, we addressed your symptoms of a urinary tract infection and discussed your history of recurrent UTIs. We also reviewed your concerns about bladder control related to multiple sclerosis.  YOUR PLAN: URINARY TRACT INFECTION: You have symptoms of a urinary tract infection, including frequent urge to urinate, pain at the end of urination, and chills. Your urine test showed signs of infection. -We will send your urine for a culture to identify the bacteria causing the infection and determine the best antibiotic. -Increase your water intake and consider drinking cranberry juice to help prevent future infections. -Please contact [facility] to schedule an appointment with Dr. Lauri Poot. Chao for further evaluation. -If your infections become more frequent, we may need to consider a cystoscopy. -Consider taking probiotics or probiotic supplements like Florajen to help prevent future infections. -Practice proper hygiene, including wiping properly and washing after intercourse.  MULTIPLE SCLEROSIS: You have multiple sclerosis and are concerned about its impact on your bladder control. -Continue following your neurologist's advice to ensure complete bladder emptying. -Sudden loss of bladder control is unlikely, so try not to worry too much about it.                      Contains text generated by Abridge.                                 Contains text generated by Abridge.

## 2024-02-23 NOTE — Assessment & Plan Note (Signed)
 Recurrent urinary tract infection (UTI) with symptoms of urgency, dysuria, and chills. Previous infection was treated successfully with Keflex . Current urinalysis shows white blood cells, blood, and protein, indicating possible infection. No back pain or fever reported. Symptoms started either Friday or Saturday. She has not yet followed up with urology as previously recommended. With one infection every three months, it may not be severe, but urology evaluation is advised if infections become more frequent. - Sent urine for culture to identify causative bacteria and appropriate antibiotic sensitivity. - Encourage increased water intake and consider cranberry juice to help prevent future infections. - Advise her to contact [facility] to schedule an appointment with Dr. Lauri Poot. Chao for further evaluation. - Discuss potential need for cystoscopy if infections become more frequent. - Recommend probiotics or probiotic supplements like Florajen to potentially prevent future infections. - Advise proper hygiene practices, including wiping properly and washing after intercourse.

## 2024-02-23 NOTE — Progress Notes (Signed)
 Acute Office Visit  Subjective:    Patient ID: Lisa Wu, female    DOB: 1980/11/29, 43 y.o.   MRN: [id number]  Chief Complaint  Patient presents with   Urinary Tract Infection    HPI: Discussed the use of AI scribe software for clinical note transcription with the patient, who gave verbal consent to proceed.  History of Present Illness   Lisa Wu is a 43 year old female with recurrent urinary tract infections who presents with symptoms of a urinary tract infection.  She experiences a frequent urge to urinate, pain and throbbing towards the end of urination, and cold chills, which began approximately 2-3 days ago. She has had recurrent urinary tract infections since Thanksgiving, with the most recent episode occurring three months ago, successfully treated with Keflex . She describes the three-month period without infection as a 'good run'.  Urine analysis showed white blood cells, blood, and protein. No back pain or fever. She has not yet followed up with the urologist as she missed her call while shopping and did not return it.  She continues to use a Mirena  IUD. She has a history of multiple sclerosis, which she worries may affect her bladder control, as advised by her neurologist to ensure complete bladder emptying. No flank pain or other symptoms.       Past Medical History:  Diagnosis Date   Migraines    Multiple sclerosis (HCC)    Neck pain    with numbness in hands   Optic neuritis    Personal history of COVID-19     Past Surgical History:  Procedure Laterality Date   OVARY SURGERY     polyp excision    Family History  Problem Relation Age of Onset   Diabetes Mother    Asthma Mother    Depression Mother    Alcoholism Father    Drug abuse Brother        Pain meds   Alcoholism Brother    Cirrhosis Brother     Social History   Socioeconomic History   Marital status: Married    Spouse name: Phyllicia Dudek   Number of children: 2    Years of education: Not on file   Highest education level: Not on file  Occupational History   Occupation: Homemaker  Tobacco Use   Smoking status: Former    Current packs/day: 0.50    Types: Cigarettes   Smokeless tobacco: Never  Vaping Use   Vaping status: Former  Substance and Sexual Activity   Alcohol use: No   Drug use: No   Sexual activity: Yes    Partners: Male  Other Topics Concern   Not on file  Social History Narrative   Pt is married with 2 children.  Caffeine  2 servings daily.    Social Drivers of Corporate investment banker Strain: Low Risk  (01/23/2022)   Overall Financial Resource Strain (CARDIA)    Difficulty of Paying Living Expenses: Not hard at all  Food Insecurity: No Food Insecurity (01/23/2022)   Hunger Vital Sign    Worried About Running Out of Food in the Last Year: Never true    Ran Out of Food in the Last Year: Never true  Transportation Needs: No Transportation Needs (01/23/2022)   PRAPARE - Administrator, Civil Service (Medical): No    Lack of Transportation (Non-Medical): No  Physical Activity: Insufficiently Active (01/23/2022)   Exercise Vital Sign    Days of Exercise per  Week: 3 days    Minutes of Exercise per Session: 20 min  Stress: No Stress Concern Present (01/23/2022)   [employer] of Occupational Health - Occupational Stress Questionnaire    Feeling of Stress : Not at all  Social Connections: Moderately Integrated (01/23/2022)   Social Connection and Isolation Panel [NHANES]    Frequency of Communication with Friends and Family: More than three times a week    Frequency of Social Gatherings with Friends and Family: More than three times a week    Attends Religious Services: More than 4 times per year    Active Member of [employer] or Organizations: No    Attends Banker Meetings: Never    Marital Status: Married  Catering manager Violence: Not At Risk (01/23/2022)   Humiliation, Afraid, Rape, and Kick questionnaire     Fear of Current or Ex-Partner: No    Emotionally Abused: No    Physically Abused: No    Sexually Abused: No    Outpatient Medications Prior to Visit  Medication Sig Dispense Refill   ALPRAZolam  (XANAX ) 0.25 MG tablet Take 1 tablet (0.25 mg total) by mouth daily as needed for anxiety (panic attacks). 15 tablet 0   aspirin -acetaminophen -caffeine  (EXCEDRIN  MIGRAINE) 250-250-65 MG tablet Take 1-2 tablets by mouth every 6 (six) hours as needed for headache.     cholecalciferol (VITAMIN D ) 1000 units tablet Take 1,000 Units by mouth daily.     gabapentin  (NEURONTIN ) 300 MG capsule Take up to 3 po daily 270 capsule 4   levonorgestrel  (MIRENA , 52 MG,) 20 MCG/DAY IUD IUD     ocrelizumab  (OCREVUS ) 300 MG/10ML injection Inject into the vein every 6 (six) months.     phentermine  15 MG capsule Take 1 capsule (15 mg total) by mouth every morning. 30 capsule 5   SUMAtriptan (IMITREX) 50 MG tablet sumatriptan 50 mg tablet  TAKE 1 TABLET BY MOUTH ONCE DAILY AS NEEDED FOR MIGRAINE. DO NOT TAKE MORE THAN 3 DAYS PER WEEK. MAX 200MG  PER DAY.     No facility-administered medications prior to visit.    No Known Allergies  Review of Systems  Constitutional:  Negative for chills, fatigue and fever.  HENT:  Negative for congestion, ear pain and sinus pain.   Respiratory:  Negative for cough and shortness of breath.   Cardiovascular:  Negative for chest pain.  Gastrointestinal:  Negative for abdominal pain, constipation, diarrhea, nausea and vomiting.  Genitourinary:  Positive for difficulty urinating, frequency and urgency.  Musculoskeletal:  Negative for myalgias.  Neurological:  Negative for headaches.       Objective:         02/23/2024   10:33 AM 01/29/2024   12:58 PM 11/06/2023    3:33 PM  Vitals with BMI  Height 5\' 4"  5\' 4"  5\' 4"   Weight 133 lbs 6 oz 133 lbs 136 lbs 10 oz  BMI 22.89 22.82 23.44  Systolic 114 95 102  Diastolic 68 61 78  Pulse 81 72 65    No data found.   Physical  Exam Vitals and nursing note reviewed.  HENT:     Head: Normocephalic and atraumatic.  Cardiovascular:     Rate and Rhythm: Normal rate and regular rhythm.  Pulmonary:     Effort: Pulmonary effort is normal.     Breath sounds: Normal breath sounds.  Abdominal:     Comments: No suprapubic tenderness No flan tenderness     There are no preventive care reminders to display for  this patient.  There are no preventive care reminders to display for this patient.   No results found for: "TSH" Lab Results  Component Value Date   WBC 7.7 01/29/2024   HGB 13.0 01/29/2024   HCT 40.2 01/29/2024   MCV 88 01/29/2024   PLT 348 01/29/2024   Lab Results  Component Value Date   NA 140 12/07/2015   K 3.7 12/07/2015   CO2 26 12/07/2015   GLUCOSE 111 (H) 12/07/2015   BUN 13 12/07/2015   CREATININE 0.64 12/07/2015   CALCIUM 9.5 12/07/2015   ANIONGAP 13 12/07/2015   No results found for: "CHOL" No results found for: "HDL" No results found for: "LDLCALC" No results found for: "TRIG" No results found for: "CHOLHDL" Lab Results  Component Value Date   HGBA1C 5.4 12/08/2015       Assessment & Plan:  Dysuria Assessment & Plan: Recurrent urinary tract infection (UTI) with symptoms of urgency, dysuria, and chills. Previous infection was treated successfully with Keflex . Current urinalysis shows white blood cells, blood, and protein, indicating possible infection. No back pain or fever reported. Symptoms started either Friday or Saturday. She has not yet followed up with urology as previously recommended. With one infection every three months, it may not be severe, but urology evaluation is advised if infections become more frequent. - Sent urine for culture to identify causative bacteria and appropriate antibiotic sensitivity. - Encourage increased water intake and consider cranberry juice to help prevent future infections. - Advise her to contact [facility] to schedule an  appointment with Dr. Lauri Poot. Chao for further evaluation. - Discuss potential need for cystoscopy if infections become more frequent. - Recommend probiotics or probiotic supplements like Florajen to potentially prevent future infections. - Advise proper hygiene practices, including wiping properly and washing after intercourse.  Orders: -     POCT URINALYSIS DIP (CLINITEK) -     Urine Culture  Multiple sclerosis (HCC) Assessment & Plan: Multiple sclerosis Multiple sclerosis (MS) with concerns about bladder control issues potentially related to MS. She expresses fear of bladder control problems worsening due to MS. Neurologist has advised ensuring complete bladder emptying to mitigate risk of infections. MS could cause bladder control problems, which may increase risk of infections, but sudden loss of bladder control is unlikely. - Continue following neurologist's advice to ensure complete bladder emptying. - Reassure her that sudden loss of bladder control is unlikely.       Other orders -     Cephalexin ; Take 1 capsule (500 mg total) by mouth 3 (three) times daily for 7 days.  Dispense: 21 capsule; Refill: 0   Assessment and Plan            Meds ordered this encounter  Medications   cephALEXin  (KEFLEX ) 500 MG capsule    Sig: Take 1 capsule (500 mg total) by mouth 3 (three) times daily for 7 days.    Dispense:  21 capsule    Refill:  0    Orders Placed This Encounter  Procedures   Urine Culture   POCT URINALYSIS DIP (CLINITEK)     Follow-up: Return if symptoms worsen or fail to improve.  An After Visit Summary was printed and given to the patient.  Laurie Lovejoy, MD [facility] [phone]

## 2024-02-23 NOTE — Assessment & Plan Note (Signed)
 Multiple sclerosis Multiple sclerosis (MS) with concerns about bladder control issues potentially related to MS. She expresses fear of bladder control problems worsening due to MS. Neurologist has advised ensuring complete bladder emptying to mitigate risk of infections. MS could cause bladder control problems, which may increase risk of infections, but sudden loss of bladder control is unlikely. - Continue following neurologist's advice to ensure complete bladder emptying. - Reassure her that sudden loss of bladder control is unlikely.

## 2024-02-27 ENCOUNTER — Ambulatory Visit: Payer: Self-pay

## 2024-02-27 LAB — URINE CULTURE

## 2024-03-03 DIAGNOSIS — N319 Neuromuscular dysfunction of bladder, unspecified: Secondary | ICD-10-CM | POA: Diagnosis not present

## 2024-03-03 DIAGNOSIS — Z79899 Other long term (current) drug therapy: Secondary | ICD-10-CM | POA: Diagnosis not present

## 2024-03-03 DIAGNOSIS — N390 Urinary tract infection, site not specified: Secondary | ICD-10-CM | POA: Diagnosis not present

## 2024-03-12 DIAGNOSIS — N390 Urinary tract infection, site not specified: Secondary | ICD-10-CM | POA: Diagnosis not present

## 2024-04-13 DIAGNOSIS — N390 Urinary tract infection, site not specified: Secondary | ICD-10-CM | POA: Diagnosis not present

## 2024-04-13 DIAGNOSIS — N319 Neuromuscular dysfunction of bladder, unspecified: Secondary | ICD-10-CM | POA: Diagnosis not present

## 2024-05-13 ENCOUNTER — Encounter: Payer: Self-pay | Admitting: Neurology

## 2024-06-03 NOTE — Telephone Encounter (Signed)
 Dr. Vear- appears pt never read your mychart. Holly followed up today to see how you want to proceed? She said at this time, her infusion 06/22/24 will need to r/s either way until we know how to proceed.

## 2024-06-07 NOTE — Telephone Encounter (Signed)
 Dr. Vear- [facility] asking for update on this

## 2024-06-08 NOTE — Telephone Encounter (Signed)
 Relayed update to [facility]

## 2024-06-14 ENCOUNTER — Other Ambulatory Visit: Payer: Self-pay | Admitting: Neurology

## 2024-06-14 DIAGNOSIS — E663 Overweight: Secondary | ICD-10-CM

## 2024-06-14 NOTE — Telephone Encounter (Signed)
 Please sign refill request for Dr. Vear

## 2024-06-14 NOTE — Telephone Encounter (Signed)
 Last filled by patient 05/14/2024 last seen was 01/29/24 next office visit is sheduled on 09/09/24 -Will continue phentermine , for fatigue 01/29/24 per ov note

## 2024-06-30 ENCOUNTER — Other Ambulatory Visit: Payer: Self-pay | Admitting: *Deleted

## 2024-06-30 DIAGNOSIS — G35D Multiple sclerosis, unspecified: Secondary | ICD-10-CM

## 2024-06-30 MED ORDER — OCREVUS 300 MG/10ML IV SOLN: 600.0000 mg | INTRAVENOUS | 1 refills | Status: AC

## 2024-06-30 NOTE — Telephone Encounter (Signed)
 Received fax from [facility] asking for us  to send rx Ocrevus  to them. Confirmed with [facility] that we need to e-scribe rx as requested. Pt approved for SP by Dr. Vear 06/08/24.  I e-scribed rx to [facility].

## 2024-07-19 ENCOUNTER — Other Ambulatory Visit: Payer: Self-pay | Admitting: Neurology

## 2024-07-19 NOTE — Telephone Encounter (Signed)
 Last seen on 01/29/24 Follow up scheduled on 09/09/24

## 2024-09-09 ENCOUNTER — Telehealth: Payer: Self-pay | Admitting: Neurology

## 2024-09-09 ENCOUNTER — Ambulatory Visit: Admitting: Neurology

## 2024-09-09 VITALS — BP 102/70 | HR 64 | Temp 98.2°F | Ht 64.0 in | Wt 134.0 lb

## 2024-09-09 DIAGNOSIS — R252 Cramp and spasm: Secondary | ICD-10-CM

## 2024-09-09 DIAGNOSIS — R3911 Hesitancy of micturition: Secondary | ICD-10-CM

## 2024-09-09 DIAGNOSIS — R29898 Other symptoms and signs involving the musculoskeletal system: Secondary | ICD-10-CM

## 2024-09-09 DIAGNOSIS — F419 Anxiety disorder, unspecified: Secondary | ICD-10-CM

## 2024-09-09 DIAGNOSIS — H532 Diplopia: Secondary | ICD-10-CM | POA: Diagnosis not present

## 2024-09-09 DIAGNOSIS — Z79899 Other long term (current) drug therapy: Secondary | ICD-10-CM | POA: Diagnosis not present

## 2024-09-09 DIAGNOSIS — G35A Relapsing-remitting multiple sclerosis: Secondary | ICD-10-CM

## 2024-09-09 NOTE — Progress Notes (Signed)
 [facility]  PATIENT: Lisa Wu DOB: 14-Jul-1981  REFERRING DOCTOR OR PCP: Dr. Annelle Hal ([facility] neurology) SOURCE: Patient, notes from [facility], imaging and lab reports, MRI images personally reviewed.  _________________________________   HISTORICAL  CHIEF COMPLAINT:  Chief Complaint  Patient presents with   Follow-up    States it was rough before last infusion, a week after infusion she was back to normal.     HISTORY OF PRESENT ILLNESS:  Lisa Wu is a 43 y.o.o woman with relapsing multiple sclerosis  Update 09/09/2024: She is Ocrevus  and tolerates it well.  Last infusion was supposed to be early October 2025 but due to insurance issues it was pushed back to late October   Labs before one infusion in 2024 showed 0.2% B cells .  Labs 01/2024 showed normal IgG and IgM.SABRA   She denies exacerbation or new symptoms.  However, she feels she has not tolerated Ocrevus  as well and feels much more tired the month or two before each infusion 'crap gap'..   12/2021 MRIs showed no new lesions.    Her gait is reduced.   Of note, she has a congenital right leg weakness and also MS related reduced balance and spasticity.   She has fallen a few times when she turns fast or if left leg gives out.    She needs to hold the bannister.  She can walk a mile but does not keep up with others well.  She reports more right leg spasticity    She once had a right AFO but did not like it.    She often needs to grab a wall or furniture while walking but does not use a cane or walker.     She has a Lhermitte sign chronically and some dysesthesias in limbs.    Gabapentin  600 mg po qHS has helped at night and the daytime sensations are not too annoying.   Gabapentin  also help right leg jerking at night.     She has urinary hesitancy despite also having urgency.   She often needs to go multiple times in a row - less now thant she tries to sit longer.   No recent UTI  but had in past.  We discussed tamsulosin if this worsens.       She reports vision is doing well.   Sometimes.  notes some diplopia whe she quickly turns her head and she feels a little off balanced when that happens.Color vision is symmetric.   Fluorescent lights annoy her  She has fatigue associated with her MS.   Sleep is non-restorative many nights.   She snores but has not been told she has gasps, snorts or other OSA signs.   She is both sleepy and tired during the day.   Leg movements sometimes makes sleep harder.    She takes a nap every Sunday but not other days.    Phentermine  helps some with fatigue and sleepiness.      She has panic attacks, often triggered by driving.  Mood has done wrose the past few months and she gets irritable easily.   Sometimes she has sweats at night.  She notes her family has gone through menopause early in general.   MS history: In 2000, she had the onset of a Lhermitte sign radiating to the arms and into her back.  This was evaluated by neurology with an MRI and a lumbar puncture and she was diagnosed with multiple sclerosis.  She was started on Betaseron.  She stopped in 2006 for a pregnancy.  She had noted some in the ejection site reactions and flulike reactions.  In 2005 she had a relapse with double vision that was treated with IV steroids.  After the pregnancy she started Rebif but had significant flulike symptoms and injection site reactions and stopped all disease modifying therapies in 2007.  Clinically she did fairly well with intermittent transient symptoms.  In December 2014 she developed right arm numbness and right hand up clumsiness.  She saw [facility] neurology and was treated with a 3-day course of IV Solu-Medrol  with improvement that was incomplete.  Around that time a neurogenic bladder was also noted with hesitancy as well as frequency/urgency.  She had relapses in June 2016 and March 2017 but was not on therapy at the time.  She had a relapse  with reduced sensation in both hands and clumsiness.   She had only partial benefit She had an active MRI of the brain May 2019 and she was initiated on Ocrevus .  MRI of the brain in March 2020 was stable.   She has no new symptoms since starting Ocrevus  and tolerates it very well.     Her last infusion was August 2022 and she was supposed to get one in February but has not had it yet.     She has never been able to move her toes on the right foot.  She has severe muscle atrophy in the lower right leg.       MRI images personally reviewed: MRI of the brain 12/07/2015 showed multiple T2/STIR hyperintense foci in the periventricular, juxtacortical and deep white matter of the hemispheres.  Foci are also noted in the pons and in the right thalamus/internal capsule.  1 focus in the left parieto-occipital lobe enhanced after contrast.  MRI of the brain 01/31/2018 showed T2/FLAIR hyperintense foci in the left cerebellar hemisphere, left middle cerebellar peduncle, pons, right p thalamus/osterior limb of the internal capsule and in the periventricular, juxtacortical and deep white matter of the hemispheres.  1 focus in the left frontal lobe enhanced after contrast consistent with an acute demyelinating plaque.    Additionally, compared to the 2017 MRI there were several other new foci in the hemispheres and the focus in the cerebellum was noted.\\  MRI of the cervical spine 01/31/2018 showed multiple T2 hyperintense foci located to the left adjacent to C2-C3, centrally adjacent to C4, posterolaterally to the right adjacent to C5-C6, and centrally to the right adjacent to C7  MRI of the brain 12/12/2018 was essentially unchanged compared to the MRI from 01/31/2018 showing no definite new lesions and no enhancing foci.  MRI brain 01/02/2022 showed T2/FLAIR hyperintense foci in the cerebral hemispheres and a few foci in the pons, left middle cerebellar peduncle and left thalamus in a pattern consistent with chronic  demyelinating plaque associated with multiple sclerosis.  None of the foci enhance or appear to be acute.  Compared to the MRI dated 01/31/2018, there are no new lesions  MRI cervical spine 01/02/2022 Multiple T2 hyperintense foci within the spinal cord as detailed above consistent with chronic demyelinating plaque associated with multiple sclerosis.  None of the foci enhance.  No definite changes compared to the MRI from 01/31/2018. 2.   Minimal disc degenerative changes at C5-C6 and C6-C7 that do not lead to foraminal narrowing, spinal stenosis or nerve root compression.  REVIEW OF SYSTEMS: Constitutional: No fevers, chills, sweats, or change in  appetite.  She has fatigue Eyes: No visual changes, double vision, eye pain Ear, nose and throat: No hearing loss, ear pain, nasal congestion, sore throat Cardiovascular: No chest pain, palpitations Respiratory:  No shortness of breath at rest or with exertion.   No wheezes GastrointestinaI: No nausea, vomiting, diarrhea, abdominal pain, fecal incontinence Genitourinary: She has urinary hesitancy as well as urgency. Musculoskeletal:  No neck pain, back pain Integumentary: No rash, pruritus, skin lesions Neurological: as above Psychiatric: No depression at this time.  No anxiety Endocrine: No palpitations, diaphoresis, change in appetite, change in weigh or increased thirst Hematologic/Lymphatic:  No anemia, purpura, petechiae. Allergic/Immunologic: No itchy/runny eyes, nasal congestion, recent allergic reactions, rashes  ALLERGIES: No Known Allergies  HOME MEDICATIONS:  Current Outpatient Medications:    ALPRAZolam  (XANAX ) 0.25 MG tablet, Take 1 tablet (0.25 mg total) by mouth daily as needed for anxiety (panic attacks)., Disp: 15 tablet, Rfl: 0   aspirin -acetaminophen -caffeine  (EXCEDRIN  MIGRAINE) 250-250-65 MG tablet, Take 1-2 tablets by mouth every 6 (six) hours as needed for headache., Disp: , Rfl:    cholecalciferol (VITAMIN D ) 1000 units  tablet, Take 1,000 Units by mouth daily., Disp: , Rfl:    Cranberry-D Mannose 500-50 MG CHEW, Chew by mouth., Disp: , Rfl:    gabapentin  (NEURONTIN ) 300 MG capsule, TAKE UP TO 3 CAPSULES BY MOUTH ONCE DAILY, Disp: 90 capsule, Rfl: 1   levonorgestrel  (MIRENA , 52 MG,) 20 MCG/DAY IUD, IUD, Disp: , Rfl:    ocrelizumab  (OCREVUS ) 300 MG/10ML injection, Inject 20 mLs (600 mg total) into the vein every 6 (six) months., Disp: 20 mL, Rfl: 1   phentermine  15 MG capsule, Take 1 capsule by mouth in the morning, Disp: 30 capsule, Rfl: 3   SUMAtriptan (IMITREX) 50 MG tablet, sumatriptan 50 mg tablet  TAKE 1 TABLET BY MOUTH ONCE DAILY AS NEEDED FOR MIGRAINE. DO NOT TAKE MORE THAN 3 DAYS PER WEEK. MAX 200MG  PER DAY., Disp: , Rfl:   PAST MEDICAL HISTORY: Past Medical History:  Diagnosis Date   Migraines    Multiple sclerosis    Neck pain    with numbness in hands   Optic neuritis    Personal history of COVID-19     PAST SURGICAL HISTORY: Past Surgical History:  Procedure Laterality Date   OVARY SURGERY     polyp excision    FAMILY HISTORY: Family History  Problem Relation Age of Onset   Diabetes Mother    Asthma Mother    Depression Mother    Alcoholism Father    Drug abuse Brother        Pain meds   Alcoholism Brother    Cirrhosis Brother     SOCIAL HISTORY:  Social History   Socioeconomic History   Marital status: Married    Spouse name: Harley Mccartney   Number of children: 2   Years of education: Not on file   Highest education level: Not on file  Occupational History   Occupation: Homemaker  Tobacco Use   Smoking status: Former    Current packs/day: 0.50    Types: Cigarettes   Smokeless tobacco: Never  Vaping Use   Vaping status: Former  Substance and Sexual Activity   Alcohol use: No   Drug use: No   Sexual activity: Yes    Partners: Male  Other Topics Concern   Not on file  Social History Narrative   Pt is married with 2 children.  Caffeine  2 servings daily.     Social Drivers of  Health   Tobacco Use: Medium Risk (09/09/2024)   Patient History    Smoking Tobacco Use: Former    Smokeless Tobacco Use: Never    Passive Exposure: Not on file  Financial Resource Strain: Low Risk (01/23/2022)   Overall Financial Resource Strain (CARDIA)    Difficulty of Paying Living Expenses: Not hard at all  Food Insecurity: No Food Insecurity (01/23/2022)   Hunger Vital Sign    Worried About Running Out of Food in the Last Year: Never true    Ran Out of Food in the Last Year: Never true  Transportation Needs: No Transportation Needs (01/23/2022)   PRAPARE - Administrator, Civil Service (Medical): No    Lack of Transportation (Non-Medical): No  Physical Activity: Insufficiently Active (01/23/2022)   Exercise Vital Sign    Days of Exercise per Week: 3 days    Minutes of Exercise per Session: 20 min  Stress: No Stress Concern Present (01/23/2022)   [employer] of Occupational Health - Occupational Stress Questionnaire    Feeling of Stress : Not at all  Social Connections: Moderately Integrated (01/23/2022)   Social Connection and Isolation Panel    Frequency of Communication with Friends and Family: More than three times a week    Frequency of Social Gatherings with Friends and Family: More than three times a week    Attends Religious Services: More than 4 times per year    Active Member of Clubs or Organizations: No    Attends Banker Meetings: Never    Marital Status: Married  Catering Manager Violence: Not At Risk (01/23/2022)   Humiliation, Afraid, Rape, and Kick questionnaire    Fear of Current or Ex-Partner: No    Emotionally Abused: No    Physically Abused: No    Sexually Abused: No  Depression (PHQ2-9): Low Risk (02/23/2024)   Depression (PHQ2-9)    PHQ-2 Score: 0  Alcohol Screen: Low Risk (08/28/2022)   Alcohol Screen    Last Alcohol Screening Score (AUDIT): 0  Housing: Low Risk (01/23/2022)   Housing    Last Housing Risk  Score: 0  Utilities: Not At Risk (08/28/2022)   AHC Utilities    Threatened with loss of utilities: No  Health Literacy: Not on file     PHYSICAL EXAM  Vitals:   09/09/24 1416  BP: 102/70  Pulse: 64  Temp: 98.2 F (36.8 C)  TempSrc: Oral  SpO2: 95%  Weight: 134 lb (60.8 kg)  Height: 5' 4 (1.626 m)    Body mass index is 23 kg/m.   General: The patient is well-developed and well-nourished and in no acute distress  HEENT:  Head is [place]/AT.  Sclera are anicteric.     Skin: Extremities are without rash or  edema.  Musculoskeletal/limbs  Back is non-tender.  The left leg is slightly shorter than the left and her foot is mildly smaller  Neurologic Exam  Mental status: The patient is alert and oriented x 3 at the time of the examination. The patient has apparent normal recent and remote memory, with an apparently normal attention span and concentration ability.   Speech is normal.  Cranial nerves: Extraocular movements are full.  Facial strength and sensation is normal.  No dysarthria.  No obvious hearing deficits are noted.  Motor:  Muscle bulk is mildly reduced in lower right leg.   Tone is increased in right leg. Strength is  5 / 5 in arms and left leg but 4/5 proximal  right leg and 2/5 right ankle extension, 2/5 gastrocneiums. 1 toes.   Sensory: Sensory testing is intact to pinprick, soft touch and vibration sensation in all 4 extremities.  Coordination: Cerebellar testing reveals good finger-nose-finger and heel-to-shin bilaterally.  Gait and station: Station is normal.   She has a mild right foot drop.   The tandem gait is poor. Romberg is negative.   Reflexes: Deep tendon reflexes are symmetric and normal bilaterally.      DIAGNOSTIC DATA (LABS, IMAGING, TESTING) - I reviewed patient records, labs, notes, testing and imaging myself where available.  Lab Results  Component Value Date   WBC 7.7 01/29/2024   HGB 13.0 01/29/2024   HCT 40.2 01/29/2024   MCV 88  01/29/2024   PLT 348 01/29/2024      Component Value Date/Time   NA 140 12/07/2015 2037   K 3.7 12/07/2015 2037   CL 101 12/07/2015 2037   CO2 26 12/07/2015 2037   GLUCOSE 111 (H) 12/07/2015 2037   BUN 13 12/07/2015 2037   CREATININE 0.64 12/07/2015 2037   CALCIUM 9.5 12/07/2015 2037   GFRNONAA >60 12/07/2015 2037   GFRAA >60 12/07/2015 2037    Lab Results  Component Value Date   HGBA1C 5.4 12/08/2015      ASSESSMENT AND PLAN  Multiple sclerosis, relapsing-remitting  High risk medication use  Anxiety disorder with panic attacks  Spasticity  Right leg weakness  Urinary hesitancy  Double vision   Continue Ocrevus .  Last infusion was October 2025  Check labs.   If Ocrevus  tolerability worsens, we could consider Briumvi   Check MRi brain -- if breakthrough activity is seen, consider a different category DMT. Will continue phentermine , for fatigue - weight is stable compared to last year.    Continue gabapentin  for RLS/spasms If urinary hesitancy worsens, consider tamsulosin. If she gets many more panic attacks, consider an SSRI or Buspar.    Return in 6 months or sooner if there are new or worsening neurologic symptoms.  Reakwon Barren A. Vear, MD, [facility] 09/09/2024, 2:43 PM Certified in Neurology, Clinical Neurophysiology, Sleep Medicine and Neuroimaging  [facility] [address] [place], [place] [address] [phone]

## 2024-09-09 NOTE — Telephone Encounter (Signed)
MRI order sent to [place] [phone]

## 2024-09-10 ENCOUNTER — Ambulatory Visit: Payer: Self-pay | Admitting: Neurology

## 2024-09-10 LAB — CBC WITH DIFFERENTIAL/PLATELET
Basophils Absolute: 0.1 x10E3/uL (ref 0.0–0.2)
Basos: 1 %
EOS (ABSOLUTE): 0.1 x10E3/uL (ref 0.0–0.4)
Eos: 2 %
Hematocrit: 39.6 % (ref 34.0–46.6)
Hemoglobin: 13.1 g/dL (ref 11.1–15.9)
Immature Grans (Abs): 0 x10E3/uL (ref 0.0–0.1)
Immature Granulocytes: 0 %
Lymphocytes Absolute: 2.6 x10E3/uL (ref 0.7–3.1)
Lymphs: 37 %
MCH: 29.6 pg (ref 26.6–33.0)
MCHC: 33.1 g/dL (ref 31.5–35.7)
MCV: 89 fL (ref 79–97)
Monocytes Absolute: 0.4 x10E3/uL (ref 0.1–0.9)
Monocytes: 6 %
Neutrophils Absolute: 3.8 x10E3/uL (ref 1.4–7.0)
Neutrophils: 54 %
Platelets: 357 x10E3/uL (ref 150–450)
RBC: 4.43 x10E6/uL (ref 3.77–5.28)
RDW: 13.1 % (ref 11.7–15.4)
WBC: 7 x10E3/uL (ref 3.4–10.8)

## 2024-09-10 LAB — IGG, IGA, IGM
IgG (Immunoglobin G), Serum: 949 mg/dL (ref 586–1602)
IgM (Immunoglobulin M), Srm: 53 mg/dL (ref 26–217)
Immunoglobulin A, (IgA) QN, Serum: 132 mg/dL (ref 87–352)

## 2024-10-12 ENCOUNTER — Other Ambulatory Visit: Payer: Self-pay | Admitting: Neurology

## 2024-10-13 NOTE — Telephone Encounter (Signed)
 Last seen on 09/09/24 Follow up scheduled on 05/04/25

## 2024-10-21 ENCOUNTER — Other Ambulatory Visit: Payer: Self-pay | Admitting: Neurology

## 2024-10-21 DIAGNOSIS — E663 Overweight: Secondary | ICD-10-CM

## 2024-10-25 ENCOUNTER — Other Ambulatory Visit: Payer: Self-pay | Admitting: *Deleted

## 2024-10-25 DIAGNOSIS — E663 Overweight: Secondary | ICD-10-CM

## 2024-10-25 MED ORDER — PHENTERMINE HCL 15 MG PO CAPS: 15.0000 mg | ORAL_CAPSULE | Freq: Every morning | ORAL | 5 refills | Status: DC

## 2024-10-25 MED ORDER — PHENTERMINE HCL 15 MG PO CAPS: 15.0000 mg | ORAL_CAPSULE | Freq: Every morning | ORAL | 5 refills | Status: AC

## 2024-10-25 NOTE — Telephone Encounter (Signed)
 Last seen 09/09/24 Next appt 05/04/25 Dispenses   Dispensed Days Supply Quantity Provider Pharmacy  PHENTERMINE  15MG     CAP 09/23/2024 30 30 each Buck Saucer, MD [facility] [phone] ...  PHENTERMINE  15MG     CAP 08/21/2024 30 30 each Buck Saucer, MD [facility] [phone] ...  PHENTERMINE  15MG     CAP 07/20/2024 30 30 each Buck Saucer, MD [facility] [phone] ...  PHENTERMINE  15MG     CAP 06/15/2024 30 30 each Buck Saucer, MD [facility] [phone] ...  PHENTERMINE  15MG     CAP 05/14/2024 30 30 each Vear Charlie LABOR, MD [facility] [phone] ...  PHENTERMINE  15MG     CAP 04/14/2024 30 30 each Sater, Charlie LABOR, MD [facility] [phone] ...  PHENTERMINE  15MG     CAP 03/14/2024 30 30 each Sater, Charlie LABOR, MD [facility] [phone] ...  PHENTERMINE  15MG     CAP 02/16/2024 30 30 each Sater, Charlie LABOR, MD [facility] [phone] ...  PHENTERMINE  15MG     CAP 01/16/2024 30 30 each Vear Charlie LABOR, MD [facility] [phone] ...  PHENTERMINE  15MG     CAP 12/17/2023 30 30 each Sater, Charlie LABOR, MD [facility] [phone] ...  PHENTERMINE  15MG     CAP 11/19/2023 30 30 each Sater, Charlie LABOR, MD [facility] [phone] .SABRASABRA

## 2025-05-04 ENCOUNTER — Ambulatory Visit: Admitting: Neurology
